# Patient Record
Sex: Female | Born: 1990 | Race: Black or African American | Hispanic: No | Marital: Single | Smoking: Never smoker
Health system: Southern US, Community
[De-identification: ages and names within clinical notes are randomized; demographics above are authoritative.]

## PROBLEM LIST (undated history)

## (undated) ENCOUNTER — Inpatient Hospital Stay (HOSPITAL_COMMUNITY): Payer: Self-pay

## (undated) DIAGNOSIS — G4730 Sleep apnea, unspecified: Secondary | ICD-10-CM

## (undated) DIAGNOSIS — I509 Heart failure, unspecified: Secondary | ICD-10-CM

## (undated) DIAGNOSIS — N390 Urinary tract infection, site not specified: Secondary | ICD-10-CM

## (undated) HISTORY — DX: Heart failure, unspecified: I50.9

## (undated) HISTORY — DX: Sleep apnea, unspecified: G47.30

## (undated) HISTORY — PX: NO PAST SURGERIES: SHX2092

---
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
## 2011-01-27 ENCOUNTER — Encounter (HOSPITAL_COMMUNITY): Payer: Self-pay

## 2011-01-27 ENCOUNTER — Inpatient Hospital Stay (HOSPITAL_COMMUNITY)
Admission: AD | Admit: 2011-01-27 | Discharge: 2011-01-30 | DRG: 775 | Disposition: A | Discharge status: Home / Self Care | Payer: Medicaid Other | Source: Ambulatory Visit | Attending: Obstetrics and Gynecology | Admitting: Obstetrics and Gynecology

## 2011-01-27 DIAGNOSIS — Z3490 Encounter for supervision of normal pregnancy, unspecified, unspecified trimester: Secondary | ICD-10-CM

## 2011-01-27 DIAGNOSIS — O169 Unspecified maternal hypertension, unspecified trimester: Secondary | ICD-10-CM | POA: Diagnosis present

## 2011-01-27 DIAGNOSIS — IMO0002 Reserved for concepts with insufficient information to code with codable children: Secondary | ICD-10-CM

## 2011-01-27 DIAGNOSIS — Z22330 Carrier of Group B streptococcus: Secondary | ICD-10-CM

## 2011-01-27 DIAGNOSIS — O99892 Other specified diseases and conditions complicating childbirth: Secondary | ICD-10-CM | POA: Diagnosis present

## 2011-01-27 DIAGNOSIS — D649 Anemia, unspecified: Secondary | ICD-10-CM | POA: Diagnosis not present

## 2011-01-27 DIAGNOSIS — O9903 Anemia complicating the puerperium: Secondary | ICD-10-CM | POA: Diagnosis not present

## 2011-01-27 DIAGNOSIS — O99820 Streptococcus B carrier state complicating pregnancy: Secondary | ICD-10-CM

## 2011-01-27 DIAGNOSIS — O4700 False labor before 37 completed weeks of gestation, unspecified trimester: Secondary | ICD-10-CM

## 2011-01-27 HISTORY — DX: Urinary tract infection, site not specified: N39.0

## 2011-01-27 LAB — CBC
MCH: 30.2 pg (ref 26.0–34.0)
MCHC: 34.9 g/dL (ref 30.0–36.0)
Platelets: 182 10*3/uL (ref 150–400)
RDW: 13.8 % (ref 11.5–15.5)

## 2011-01-27 LAB — HIV ANTIBODY (ROUTINE TESTING W REFLEX): HIV: NONREACTIVE

## 2011-01-27 LAB — COMPREHENSIVE METABOLIC PANEL
ALT: 10 U/L (ref 0–35)
AST: 16 U/L (ref 0–37)
Albumin: 2.7 g/dL — ABNORMAL LOW (ref 3.5–5.2)
Calcium: 9.6 mg/dL (ref 8.4–10.5)
Glucose, Bld: 84 mg/dL (ref 70–99)
Potassium: 3.2 mEq/L — ABNORMAL LOW (ref 3.5–5.1)
Sodium: 136 mEq/L (ref 135–145)
Total Protein: 6.7 g/dL (ref 6.0–8.3)

## 2011-01-27 LAB — ANTIBODY SCREEN: Antibody Screen: NEGATIVE

## 2011-01-27 LAB — POCT FERN TEST: Fern Test: POSITIVE

## 2011-01-27 MED ORDER — OXYCODONE-ACETAMINOPHEN 5-325 MG PO TABS
2.0000 | ORAL_TABLET | ORAL | Status: DC | PRN
  Administered 2011-01-28: 1 via ORAL
  Filled 2011-01-27: qty 2

## 2011-01-27 MED ORDER — IBUPROFEN 600 MG PO TABS: 600.0000 mg | ORAL_TABLET | Freq: Four times a day (QID) | ORAL | Status: DC | PRN

## 2011-01-27 MED ORDER — ONDANSETRON HCL 4 MG/2ML IJ SOLN: 4.0000 mg | Freq: Four times a day (QID) | INTRAMUSCULAR | Status: DC | PRN

## 2011-01-27 MED ORDER — HYDROXYZINE HCL 50 MG/ML IM SOLN
50.0000 mg | Freq: Four times a day (QID) | INTRAMUSCULAR | Status: DC | PRN
  Filled 2011-01-27: qty 1

## 2011-01-27 MED ORDER — FLEET ENEMA 7-19 GM/118ML RE ENEM: 1.0000 | ENEMA | RECTAL | Status: DC | PRN

## 2011-01-27 MED ORDER — LACTATED RINGERS IV SOLN
INTRAVENOUS | Status: DC
  Administered 2011-01-27: 18:00:00 via INTRAVENOUS

## 2011-01-27 MED ORDER — FENTANYL 2.5 MCG/ML BUPIVACAINE 1/10 % EPIDURAL INFUSION (WH - ANES)
14.0000 mL/h | INTRAMUSCULAR | Status: DC
  Filled 2011-01-27: qty 60

## 2011-01-27 MED ORDER — EPHEDRINE 5 MG/ML INJ
10.0000 mg | INTRAVENOUS | Status: DC | PRN
  Filled 2011-01-27 (×3): qty 4

## 2011-01-27 MED ORDER — PHENYLEPHRINE 40 MCG/ML (10ML) SYRINGE FOR IV PUSH (FOR BLOOD PRESSURE SUPPORT)
80.0000 ug | PREFILLED_SYRINGE | INTRAVENOUS | Status: DC | PRN
  Filled 2011-01-27: qty 5

## 2011-01-27 MED ORDER — DINOPROSTONE 10 MG VA INST
10.0000 mg | VAGINAL_INSERT | Freq: Once | VAGINAL | Status: DC
  Filled 2011-01-27: qty 1

## 2011-01-27 MED ORDER — EPHEDRINE 5 MG/ML INJ
10.0000 mg | INTRAVENOUS | Status: DC | PRN
  Filled 2011-01-27: qty 4

## 2011-01-27 MED ORDER — ACETAMINOPHEN 325 MG PO TABS: 650.0000 mg | ORAL_TABLET | ORAL | Status: DC | PRN

## 2011-01-27 MED ORDER — LIDOCAINE HCL (PF) 1 % IJ SOLN
30.0000 mL | INTRAMUSCULAR | Status: AC | PRN
  Administered 2011-01-28: 30 mL via SUBCUTANEOUS
  Filled 2011-01-27: qty 30

## 2011-01-27 MED ORDER — LACTATED RINGERS IV SOLN: 500.0000 mL | INTRAVENOUS | Status: DC | PRN

## 2011-01-27 MED ORDER — BUTORPHANOL TARTRATE 2 MG/ML IJ SOLN
1.0000 mg | INTRAMUSCULAR | Status: DC | PRN
  Administered 2011-01-27 – 2011-01-28 (×4): 1 mg via INTRAVENOUS
  Filled 2011-01-27 (×4): qty 1

## 2011-01-27 MED ORDER — LACTATED RINGERS IV SOLN: 500.0000 mL | Freq: Once | INTRAVENOUS | Status: DC

## 2011-01-27 MED ORDER — TERBUTALINE SULFATE 1 MG/ML IJ SOLN: 0.2500 mg | Freq: Once | INTRAMUSCULAR | Status: AC | PRN

## 2011-01-27 MED ORDER — PHENYLEPHRINE 40 MCG/ML (10ML) SYRINGE FOR IV PUSH (FOR BLOOD PRESSURE SUPPORT)
80.0000 ug | PREFILLED_SYRINGE | INTRAVENOUS | Status: DC | PRN
  Filled 2011-01-27 (×2): qty 5

## 2011-01-27 MED ORDER — HYDROXYZINE HCL 50 MG PO TABS
50.0000 mg | ORAL_TABLET | Freq: Four times a day (QID) | ORAL | Status: DC | PRN
  Filled 2011-01-27: qty 1

## 2011-01-27 MED ORDER — PENICILLIN G POTASSIUM 5000000 UNITS IJ SOLR
5.0000 10*6.[IU] | Freq: Once | INTRAVENOUS | Status: AC
  Administered 2011-01-27: 5 10*6.[IU] via INTRAVENOUS
  Filled 2011-01-27: qty 5

## 2011-01-27 MED ORDER — PENICILLIN G POTASSIUM 5000000 UNITS IJ SOLR
2.5000 10*6.[IU] | INTRAVENOUS | Status: DC
  Administered 2011-01-27 – 2011-01-28 (×2): 2.5 10*6.[IU] via INTRAVENOUS
  Filled 2011-01-27 (×5): qty 2.5

## 2011-01-27 MED ORDER — OXYTOCIN BOLUS FROM INFUSION
500.0000 mL | Freq: Once | INTRAVENOUS | Status: DC
  Filled 2011-01-27: qty 500
  Filled 2011-01-27: qty 1000

## 2011-01-27 MED ORDER — CITRIC ACID-SODIUM CITRATE 334-500 MG/5ML PO SOLN: 30.0000 mL | ORAL | Status: DC | PRN

## 2011-01-27 MED ORDER — DIPHENHYDRAMINE HCL 50 MG/ML IJ SOLN: 12.5000 mg | INTRAMUSCULAR | Status: DC | PRN

## 2011-01-27 MED ORDER — OXYTOCIN 20 UNITS IN LACTATED RINGERS INFUSION - SIMPLE
125.0000 mL/h | Freq: Once | INTRAVENOUS | Status: AC
  Administered 2011-01-28: 999 mL/h via INTRAVENOUS

## 2011-01-27 NOTE — Progress Notes (Signed)
Pt states she started leaking clear fluid at 1500, no active leaking. Pt is not wearing a pad. Pt is having some contractions. States no bleeding and the baby is moving well.

## 2011-01-27 NOTE — Progress Notes (Signed)
Mary Chang is a 20 y.o. G2P0010 at [redacted]w[redacted]d by LMP admitted for rupture of membranes  Subjective: Feels contractions with 5/10 intensity.  Objective: BP 157/88  Pulse 104  Temp(Src) 98 F (36.7 C) (Oral)  Resp 20  Ht 5\' 5"  (1.651 m)  Wt 226 lb (102.513 kg)  BMI 37.61 kg/m2  SpO2 99%  BP while lying on left side is 138/88    FHT:  FHR: 120's to 130's bpm, variability: minimal ,  accelerations:  Present,  decelerations:  Absent UC:   regular, every 3-5 minutes SVE:   Dilation: 3 Effacement (%): 80 Station: -1 Exam by:: Dr. Pennie Rushing  Labs: Lab Results  Component Value Date   WBC 8.0 01/27/2011   HGB 12.2 01/27/2011   HCT 35.0* 01/27/2011   MCV 86.6 01/27/2011   PLT 182 01/27/2011    Assessment / Plan: Spontaneous labor, progressing normally  Labor: Progressing normally to this point.  Preeclampsia:  labs stable and cath urine pending Fetal Wellbeing:  Category II Pain Control:  Stadol x1 with good relief I/D:  n/a Anticipated MOD:  NSVD Will plan re chk of cx in 2 hours.  If no progress at that time, will add pitocin.  Mary Chang P 01/27/2011, 11:26 PM

## 2011-01-27 NOTE — H&P (Signed)
Mary Chang is a 20 y.o. black female presenting with CC of SROM at 1500.  Denies PIH or UTI s/s.  Clear fluid leaking; has slowed some.  Soaked through clothes and s.o. Stopped at [employer]" to get change of clothes for her.  Onset of ctxs after rupture time.  Cx in office last week 1 cm per pt recall. Hungry and only had bowl of cereal earlier today. Maternal Medical History:  Reason for admission: Reason for admission: rupture of membranes.  Contractions: Onset was 3-5 hours ago.   Frequency: regular.   Perceived severity is moderate.    Fetal activity: Perceived fetal activity is normal.   Last perceived fetal movement was within the past hour.    Prenatal complications: 1.  GBS pos in urine 09/24/10 2.  Rubella Equivocal 3.  1st trimester trich 4.  H/o sti's 5.  H/o condylomata in pregnancy 6.  S>D--nml growth u/s at 36 weeks 7.  Late to Care 8.  Elevated 1hr gtt=140; nml 3hr gtt    OB History    Grav Para Term Preterm Abortions TAB SAB Ect Mult Living   1              Past Medical History  Diagnosis Date  . UTI (lower urinary tract infection)    History reviewed. No pertinent past surgical history. Family History: family history includes Diabetes in her father and paternal grandfather; Heart disease in her maternal grandmother; and Hypertension in her father, maternal grandmother, and paternal grandfather. Social History:  reports that she quit smoking about 7 months ago. Her smoking use included Cigarettes. She quit after 1 year of use. She does not have any smokeless tobacco history on file. She reports that she uses illicit drugs (Marijuana). She reports that she does not drink alcohol.  Review of Systems  Constitutional: Negative.   Respiratory: Negative.   Cardiovascular: Negative.   Gastrointestinal: Negative.        Recent loose stools  Genitourinary: Negative.   Skin: Negative.     Dilation: 1.5 Effacement (%): 80 Station: -2 Exam by:: H. [name]  CNM Blood pressure 138/75, pulse 107, temperature 98.8 F (37.1 C), temperature source Oral, resp. rate 20, height 5' 6.5" (1.689 m), weight 102.694 kg (226 lb 6.4 oz), SpO2 99.00%. Maternal Exam:  Uterine Assessment: Contraction strength is mild.  Contraction frequency is regular.   Abdomen: Patient reports no abdominal tenderness. Estimated fetal weight is 6.5-7.5 lbs.   Fetal presentation: vertex  Introitus: Normal vagina.  Ferning test: positive.  Amniotic fluid character: clear. Healed lesions on buttocks area; vaginal and vulva w/o lesions  Pelvis: adequate for delivery.   Cervix: Cervix evaluated by digital exam.   Cx:  1.5/80/-2  Fetal Exam Fetal Monitor Review: Mode: ultrasound.   Baseline rate: 140.  Variability: moderate (6-25 bpm).   Pattern: no accelerations and no decelerations.    Fetal State Assessment: Category I - tracings are normal.     Physical Exam  Constitutional: She is oriented to person, place, and time. She appears well-developed and well-nourished.       Grimace w/ some ctxs  Cardiovascular: Normal rate and regular rhythm.   Respiratory: Effort normal and breath sounds normal.  GI: Soft. Bowel sounds are normal.  Musculoskeletal: She exhibits edema.       1+ BLE edema  Neurological: She is alert and oriented to person, place, and time. She has normal reflexes.  Skin: Skin is warm and dry.  Psychiatric: She has  a normal mood and affect. Her behavior is normal. Judgment and thought content normal.    Prenatal labs: ABO, Rh:  O positive  Antibody:  neg Rubella:  equivocal RPR:   NR HBsAg:   Neg HIV:   NR GBS:   positive in Urine 09/24/10 Hgb Electrophoresis:  Neg  Assessment/Plan: 1.  IUP at 37.5 2.  SROM 1500 3.  Early labor 4.  GBS positive 5.  2 elevated BP readings on arrival to MAU 6.  Nonreactive NST at present, but neg CST and reassuring  1.  Admit to BS with Dr. Pennie Rushing as attending 2.  Routine L&D orders 3.  Add PIH labs;  will do cath u/a once gets epidural 4.  Pitocin or cervidil prn augmentation 5.  C/w MD prn; CTO BP closely  Adalid Beckmann H 01/27/2011, 5:25 PM

## 2011-01-28 ENCOUNTER — Encounter (HOSPITAL_COMMUNITY): Payer: Self-pay | Admitting: Anesthesiology

## 2011-01-28 ENCOUNTER — Encounter (HOSPITAL_COMMUNITY): Payer: Self-pay | Admitting: Family Medicine

## 2011-01-28 DIAGNOSIS — Z3490 Encounter for supervision of normal pregnancy, unspecified, unspecified trimester: Secondary | ICD-10-CM

## 2011-01-28 DIAGNOSIS — O169 Unspecified maternal hypertension, unspecified trimester: Secondary | ICD-10-CM | POA: Diagnosis present

## 2011-01-28 LAB — URINE MICROSCOPIC-ADD ON

## 2011-01-28 LAB — ABO/RH: ABO/RH(D): O POS

## 2011-01-28 LAB — URINALYSIS, ROUTINE W REFLEX MICROSCOPIC
Glucose, UA: NEGATIVE mg/dL
Specific Gravity, Urine: 1.01 (ref 1.005–1.030)

## 2011-01-28 LAB — CBC
Platelets: 187 10*3/uL (ref 150–400)
RDW: 14 % (ref 11.5–15.5)
WBC: 8.9 10*3/uL (ref 4.0–10.5)

## 2011-01-28 LAB — COMPREHENSIVE METABOLIC PANEL
Albumin: 2.3 g/dL — ABNORMAL LOW (ref 3.5–5.2)
BUN: 4 mg/dL — ABNORMAL LOW (ref 6–23)
Chloride: 99 mEq/L (ref 96–112)
Creatinine, Ser: 0.48 mg/dL — ABNORMAL LOW (ref 0.50–1.10)
GFR calc Af Amer: >90 mL/min (ref >90)
Glucose, Bld: 126 mg/dL — ABNORMAL HIGH (ref 70–99)
Total Bilirubin: 0.4 mg/dL (ref 0.3–1.2)
Total Protein: 5.8 g/dL — ABNORMAL LOW (ref 6.0–8.3)

## 2011-01-28 LAB — URIC ACID: Uric Acid, Serum: 5.7 mg/dL (ref 2.4–7.0)

## 2011-01-28 MED ORDER — OXYCODONE-ACETAMINOPHEN 5-325 MG PO TABS: 1.0000 | ORAL_TABLET | ORAL | Status: DC | PRN

## 2011-01-28 MED ORDER — MISOPROSTOL 200 MCG PO TABS
ORAL_TABLET | ORAL | Status: AC
  Administered 2011-01-28: 800 ug
  Filled 2011-01-28: qty 4

## 2011-01-28 MED ORDER — MEDROXYPROGESTERONE ACETATE 150 MG/ML IM SUSP: 150.0000 mg | INTRAMUSCULAR | Status: DC | PRN

## 2011-01-28 MED ORDER — ONDANSETRON HCL 4 MG/2ML IJ SOLN: 4.0000 mg | INTRAMUSCULAR | Status: DC | PRN

## 2011-01-28 MED ORDER — ONDANSETRON HCL 4 MG PO TABS: 4.0000 mg | ORAL_TABLET | ORAL | Status: DC | PRN

## 2011-01-28 MED ORDER — LACTATED RINGERS IV SOLN: INTRAVENOUS | Status: DC

## 2011-01-28 MED ORDER — SIMETHICONE 80 MG PO CHEW: 80.0000 mg | CHEWABLE_TABLET | ORAL | Status: DC | PRN

## 2011-01-28 MED ORDER — BENZOCAINE-MENTHOL 20-0.5 % EX AERO: 1.0000 "application " | INHALATION_SPRAY | CUTANEOUS | Status: DC | PRN

## 2011-01-28 MED ORDER — IBUPROFEN 600 MG PO TABS
600.0000 mg | ORAL_TABLET | Freq: Four times a day (QID) | ORAL | Status: DC
  Administered 2011-01-28 – 2011-01-30 (×6): 600 mg via ORAL
  Filled 2011-01-28 (×7): qty 1

## 2011-01-28 MED ORDER — LABETALOL HCL 5 MG/ML IV SOLN
10.0000 mg | INTRAVENOUS | Status: DC | PRN
  Administered 2011-01-28 (×2): 10 mg via INTRAVENOUS
  Filled 2011-01-28 (×3): qty 4

## 2011-01-28 MED ORDER — BENZOCAINE-MENTHOL 20-0.5 % EX AERO
INHALATION_SPRAY | CUTANEOUS | Status: AC
  Filled 2011-01-28: qty 56

## 2011-01-28 MED ORDER — SENNOSIDES-DOCUSATE SODIUM 8.6-50 MG PO TABS
2.0000 | ORAL_TABLET | Freq: Every day | ORAL | Status: DC
  Administered 2011-01-28 – 2011-01-29 (×2): 2 via ORAL

## 2011-01-28 MED ORDER — DIPHENHYDRAMINE HCL 25 MG PO CAPS: 25.0000 mg | ORAL_CAPSULE | Freq: Four times a day (QID) | ORAL | Status: DC | PRN

## 2011-01-28 MED ORDER — TETANUS-DIPHTH-ACELL PERTUSSIS 5-2.5-18.5 LF-MCG/0.5 IM SUSP: 0.5000 mL | Freq: Once | INTRAMUSCULAR | Status: DC

## 2011-01-28 MED ORDER — PRENATAL PLUS 27-1 MG PO TABS
1.0000 | ORAL_TABLET | Freq: Every day | ORAL | Status: DC
  Administered 2011-01-28 – 2011-01-30 (×3): 1 via ORAL
  Filled 2011-01-28 (×3): qty 1

## 2011-01-28 MED ORDER — WITCH HAZEL-GLYCERIN EX PADS: 1.0000 "application " | MEDICATED_PAD | CUTANEOUS | Status: DC | PRN

## 2011-01-28 MED ORDER — LANOLIN HYDROUS EX OINT: TOPICAL_OINTMENT | CUTANEOUS | Status: DC | PRN

## 2011-01-28 MED ORDER — MISOPROSTOL 200 MCG PO TABS
ORAL_TABLET | ORAL | Status: AC
  Filled 2011-01-28: qty 1

## 2011-01-28 MED ORDER — DIBUCAINE 1 % RE OINT: 1.0000 "application " | TOPICAL_OINTMENT | RECTAL | Status: DC | PRN

## 2011-01-28 MED ORDER — ZOLPIDEM TARTRATE 5 MG PO TABS: 5.0000 mg | ORAL_TABLET | Freq: Every evening | ORAL | Status: DC | PRN

## 2011-01-28 NOTE — Progress Notes (Signed)
NSVD of a viable female.  

## 2011-01-28 NOTE — Progress Notes (Signed)
PSYCHOSOCIAL ASSESSMENT ~ MATERNAL/CHILD Name:  Mary Chang                                                                                                         Age: 20  Referral Date:        10/  02 /  12 Reason/Source: History of MJ use / CN  I. FAMILY/HOME ENVIRONMENT A. Child's Legal Guardian _X__Parent(s) ___Grandparent ___Foster parent ___DSS_________________ Name:  Mary Chang                                                              DOB: //                     Age: 20  Address: [address] ; [place], [place] [address]  Name:    Mary Chang                                                            DOB: //                     Age: 33  Address: (same as above)   B. Other Household Members/Support Persons Name:                                         Relationship:                        DOB ___/___/___                   Name:                                         Relationship:                        DOB ___/___/___                   Name:                                         Relationship:                        DOB ___/___/___                   

## 2011-01-28 NOTE — Anesthesia Preprocedure Evaluation (Deleted)
Anesthesia Evaluation  Name, MR# and DOB Patient awake  General Assessment Comment  Reviewed: Allergy & Precautions, H&P , Patient's Chart, lab work & pertinent test results  Airway Mallampati: III TM Distance: >3 FB Neck ROM: full    Dental No notable dental hx.    Pulmonary  clear to auscultation  Pulmonary exam normal       Cardiovascular hypertension, regular Normal    Neuro/Psych Negative Neurological ROS  Negative Psych ROS   GI/Hepatic negative GI ROS Neg liver ROS    Endo/Other  Negative Endocrine ROSMorbid obesity  Renal/GU negative Renal ROS     Musculoskeletal   Abdominal   Peds  Hematology negative hematology ROS (+)   Anesthesia Other Findings   Reproductive/Obstetrics (+) Pregnancy                           Anesthesia Physical Anesthesia Plan  ASA: III  Anesthesia Plan: Epidural   Post-op Pain Management:    Induction:   Airway Management Planned:   Additional Equipment:   Intra-op Plan:   Post-operative Plan:   Informed Consent: I have reviewed the patients History and Physical, chart, labs and discussed the procedure including the risks, benefits and alternatives for the proposed anesthesia with the patient or authorized representative who has indicated his/her understanding and acceptance.     Plan Discussed with:   Anesthesia Plan Comments: (Discussed risk and benefits.  Patient was somnolent and unable to express her will to have an epidural.  She however expressed her concerns and fears.  Her mother cried and screamed that she did not want her to have an epidural.  Her husband stated that if anything went wrong that "some criminal shit would go down".  I expressed that epidurals were safe but not risk free and that they would have to accept the risks in order to get one.  She had been given IV pain medicine and fell asleep in between contractions but then wake  up during them.  She either couldn't or didn't want to give consent, so the plan was to return around 6 am if she decided that she wanted to proceed understanding the risks.  )       Anesthesia Quick Evaluation

## 2011-01-28 NOTE — Op Note (Signed)
Delivery Note At 4:40 AM a viable female was delivered via Vaginal, Spontaneous Delivery (Presentation: Middle Occiput Anterior).  APGAR: 9, 9; weight 7 lb 1 oz (3204 g).   Placenta status: Intact, Spontaneous.  Cord: 3 vessels with the following complications: None.  Cord pH: n/a  Anesthesia: Local  Episiotomy: None Lacerations: 2nd degree;Perineal Suture Repair: 3.0 vicryl Est. Blood Loss (mL): 750  Mom to postpartum.  Baby to nursery-stable. Pt had elevated BPs during labor requiring a single does of IV labetalol.  All PIH labs nl.  Billy Turvey P 01/28/2011, 5:19 AM

## 2011-01-28 NOTE — Progress Notes (Signed)
UR chart review completed.  

## 2011-01-28 NOTE — Progress Notes (Signed)
Mary Chang is a 20 y.o. G2P0010 at [redacted]w[redacted]d by LMP admitted for rupture of membranes  Subjective: Significant pain from contractions.  Relief between contractions from Stadol.  Feels some pressure   Objective: BP 149/103  Pulse 97  Temp(Src) 98.4 F (36.9 C) (Oral)  Resp 18  Ht 5\' 5"  (1.651 m)  Wt 226 lb (102.513 kg)  BMI 37.61 kg/m2  SpO2 98%      FHT:  FHR: 150's bpm, variability: moderate,  accelerations:  Present,  decelerations:  Present occassionally UC:   regular, every 3-5 minutes SVE:   Dilation: 7 Effacement (%): 90 Station: -1 Exam by:: Dr. Pennie Rushing  Labs: Lab Results  Component Value Date   WBC 8.9 01/28/2011   HGB 11.9* 01/28/2011   HCT 34.9* 01/28/2011   MCV 87.0 01/28/2011   PLT 187 01/28/2011    Assessment / Plan:   Labor: no cervical change in the last hour Preeclampsia:  elevated blood pressures requiring a single does of labetalol.  All other PIH parameters including serology and urine are negative Fetal Wellbeing:  Category II Pain Control:  Pt declines epidural.  Wants to continue to use Stadol for pain relief. I/D:  n/a Anticipated MOD:  NSVD Pt currently declines pitocin augmentation in spite of no change in cervix over the last hour.  She agrees to reassessment in 1 hour for posssible pitocin augmentation.  Mary Chang P 01/28/2011, 4:28 AM

## 2011-01-29 LAB — CBC
Hemoglobin: 10.5 g/dL — ABNORMAL LOW (ref 12.0–15.0)
MCH: 30.3 pg (ref 26.0–34.0)
MCHC: 34.8 g/dL (ref 30.0–36.0)
Platelets: 166 10*3/uL (ref 150–400)

## 2011-01-29 NOTE — Progress Notes (Signed)
  Post Partum Day 1 Subjective: no complaints, up ad lib, voiding, tolerating PO and SO at bs. pt seems somewhat distracted when reviewing assessment and POC. infant w bili light in room being attended to by nurse at the moment BF attempting, using bottle overnight and now Mood stable, bonding well Pt denies HA/N/V epigastric pain.   Objective: Blood pressure 122/79, pulse 111, temperature 98.3 F (36.8 C), temperature source Oral, resp. rate 20, height 5\' 5"  (1.651 m), weight 102.513 kg (226 lb), SpO2 99.00%,   Filed Vitals:   01/28/11 1145 01/28/11 1420 01/28/11 2000 01/29/11 0619  BP: 136/89 141/91 138/82 122/79  Pulse: 118 111 108 111  Temp: 98.9 F (37.2 C) 98.8 F (37.1 C) 98 F (36.7 C) 98.3 F (36.8 C)  TempSrc: Oral Oral Oral Oral  Resp: 20 20 18 20   Height:      Weight:      SpO2:   99%      Physical Exam:  General: alert, distracted, fatigued and no distress Lungs: CTAB Heart: RRR Lochia: appropriate Uterine Fundus: firm Incision: healing well DVT Evaluation: No evidence of DVT seen on physical exam. Negative Homan's sign.   Basename 01/29/11 0615 01/28/11 0315  HGB 10.5* 11.9*  HCT 30.2* 34.9*    Assessment/Plan: Plan for discharge tomorrow, Breastfeeding, Lactation consult, Social Work consult and Circumcision at office Discussed contraception options, pt unsure but thinks would like pill Plan for flu shot and TDAP if not already done before discharge.   Will CTO BP, consider 24hr urine collection [facility] labs were WNL   LOS: 2 days   Belen Zwahlen M 01/29/2011, 8:35 AM

## 2011-01-30 MED ORDER — NORETHINDRONE 0.35 MG PO TABS: 1.0000 | ORAL_TABLET | Freq: Every day | ORAL | Status: DC

## 2011-01-30 MED ORDER — IBUPROFEN 600 MG PO TABS: 600.0000 mg | ORAL_TABLET | Freq: Four times a day (QID) | ORAL | Status: AC

## 2011-01-30 NOTE — Progress Notes (Signed)
  Post Partum Day 2 Subjective: no complaints, up ad lib, voiding, tolerating PO, + flatus and +BM BF pumping, baby using bottle now Mood stable, bonding well   Objective: Blood pressure 126/89, pulse 117, temperature 98.2 F (36.8 C), temperature source Oral, resp. rate 22, height 5\' 5"  (1.651 m), weight 102.513 kg (226 lb), SpO2 99.00%, unknown if currently breastfeeding.  Physical Exam:  General: alert and no distress Lungs: CTAB Heart: RRR Lochia: appropriate Uterine Fundus: firm Incision: healing well DVT Evaluation: No evidence of DVT seen on physical exam. Negative Homan's sign.   Basename 01/29/11 0615 01/28/11 0315  HGB 10.5* 11.9*  HCT 30.2* 34.9*    Assessment/Plan: Discharge home, Breastfeeding and Contraception pt desires OCP Mild anemia, continue FE and PNV at home     LOS: 3 days   Laurieann Friddle M 01/30/2011, 9:55 AM

## 2011-01-30 NOTE — Plan of Care (Signed)
Problem: Discharge Progression Outcomes Goal: MMR given as ordered Outcome: Not Progressing refused

## 2011-01-30 NOTE — Discharge Summary (Signed)
   Obstetric Discharge Summary Reason for Admission: rupture of membranes Prenatal Procedures: ultrasound Intrapartum Procedures: spontaneous vaginal delivery and GBS prophylaxis Postpartum Procedures: tdap Complications-Operative and Postpartum: none  Temp:  [98.2 F (36.8 C)-98.7 F (37.1 C)] 98.2 F (36.8 C) (10/04 0505) Pulse Rate:  [105-117] 117  (10/04 0505) Resp:  [20-22] 22  (10/04 0505) BP: (125-147)/(86-96) 126/89 mmHg (10/04 0505) Hemoglobin  Date Value Range Status  01/29/2011 10.5* 12.0-15.0 (g/dL) Final     HCT  Date Value Range Status  01/29/2011 30.2* 36.0-46.0 (%) Final    Hospital Course:  Pt arrived with SROM and no labor signs, active labor started approximately 8 hours later, pt had stadol for pain mgmnt and had an SVD of a boy without complications, She followed a routine pp course.   Discharge Diagnoses: Term Pregnancy-delivered  Discharge Information: Date: 01/30/2011 Activity: pelvic rest Diet: routine Medications:  Medication List  As of 01/30/2011 10:01 AM   START taking these medications         ibuprofen 600 MG tablet   Commonly known as: ADVIL,MOTRIN   Take 1 tablet (600 mg total) by mouth every 6 (six) hours.      norethindrone 0.35 MG tablet   Commonly known as: MICRONOR,CAMILA,ERRIN   Take 1 tablet (0.35 mg total) by mouth daily.   Start taking on: 02/13/2011         CONTINUE taking these medications         prenatal vitamin w/FE, FA 27-1 MG Tabs          Where to get your medications    These are the prescriptions that you need to pick up.   You may get these medications from any pharmacy.         ibuprofen 600 MG tablet   norethindrone 0.35 MG tablet           Condition: stable Instructions: refer to practice specific booklet Discharge to: home Follow-up Information    Follow up with Janine Limbo, MD in 6 weeks.   Contact information:   [address] [place]  [address] [phone]          Newborn Data: Live born  Information for the patient's newborn:  Chang, Boy Mary [[id number]]  female APGAR 9, 9,   weight ; 7#1oz Home with mother.  Mary Chang 01/30/2011, 10:01 AM

## 2011-02-02 ENCOUNTER — Encounter (HOSPITAL_COMMUNITY): Payer: Self-pay

## 2011-02-02 ENCOUNTER — Inpatient Hospital Stay (HOSPITAL_COMMUNITY): Payer: Medicaid Other

## 2011-02-02 ENCOUNTER — Other Ambulatory Visit: Payer: Self-pay

## 2011-02-02 ENCOUNTER — Inpatient Hospital Stay (HOSPITAL_COMMUNITY)
Admission: AD | Admit: 2011-02-02 | Discharge: 2011-02-03 | DRG: 776 | Disposition: A | Discharge status: Home / Self Care | Payer: Medicaid Other | Source: Ambulatory Visit | Attending: Obstetrics and Gynecology | Admitting: Obstetrics and Gynecology

## 2011-02-02 DIAGNOSIS — J811 Chronic pulmonary edema: Secondary | ICD-10-CM

## 2011-02-02 DIAGNOSIS — IMO0001 Reserved for inherently not codable concepts without codable children: Secondary | ICD-10-CM

## 2011-02-02 DIAGNOSIS — O99893 Other specified diseases and conditions complicating puerperium: Principal | ICD-10-CM | POA: Diagnosis present

## 2011-02-02 DIAGNOSIS — G4730 Sleep apnea, unspecified: Secondary | ICD-10-CM | POA: Diagnosis present

## 2011-02-02 DIAGNOSIS — D649 Anemia, unspecified: Secondary | ICD-10-CM

## 2011-02-02 HISTORY — DX: Sleep apnea, unspecified: G47.30

## 2011-02-02 LAB — COMPREHENSIVE METABOLIC PANEL
ALT: 32 U/L (ref 0–35)
AST: 40 U/L — ABNORMAL HIGH (ref 0–37)
Alkaline Phosphatase: 91 U/L (ref 39–117)
CO2: 26 mEq/L (ref 19–32)
Calcium: 8.5 mg/dL (ref 8.4–10.5)
Chloride: 102 mEq/L (ref 96–112)
GFR calc Af Amer: >90 mL/min (ref >90)
GFR calc non Af Amer: >90 mL/min (ref >90)
Glucose, Bld: 75 mg/dL (ref 70–99)
Potassium: 3.1 mEq/L — ABNORMAL LOW (ref 3.5–5.1)
Sodium: 136 mEq/L (ref 135–145)
Total Bilirubin: 0.5 mg/dL (ref 0.3–1.2)

## 2011-02-02 LAB — URINALYSIS, ROUTINE W REFLEX MICROSCOPIC
Leukocytes, UA: NEGATIVE
Nitrite: NEGATIVE
Specific Gravity, Urine: 1.015 (ref 1.005–1.030)
Urobilinogen, UA: 0.2 mg/dL (ref 0.0–1.0)
pH: 7.5 (ref 5.0–8.0)

## 2011-02-02 LAB — DIFFERENTIAL
Basophils Absolute: 0 10*3/uL (ref 0.0–0.1)
Eosinophils Relative: 3 % (ref 0–5)
Lymphocytes Relative: 13 % (ref 12–46)
Lymphs Abs: 1.2 10*3/uL (ref 0.7–4.0)
Neutro Abs: 7.4 10*3/uL (ref 1.7–7.7)
Neutrophils Relative %: 77 % (ref 43–77)

## 2011-02-02 LAB — CBC
MCV: 87.5 fL (ref 78.0–100.0)
Platelets: 253 10*3/uL (ref 150–400)
RBC: 3.43 MIL/uL — ABNORMAL LOW (ref 3.87–5.11)
RDW: 13.8 % (ref 11.5–15.5)
WBC: 9.6 10*3/uL (ref 4.0–10.5)

## 2011-02-02 LAB — RAPID URINE DRUG SCREEN, HOSP PERFORMED
Amphetamines: NOT DETECTED
Cocaine: NOT DETECTED
Opiates: NOT DETECTED
Tetrahydrocannabinol: NOT DETECTED

## 2011-02-02 LAB — RAPID STREP SCREEN (MED CTR MEBANE ONLY): Streptococcus, Group A Screen (Direct): NEGATIVE

## 2011-02-02 MED ORDER — IBUPROFEN 600 MG PO TABS
600.0000 mg | ORAL_TABLET | Freq: Four times a day (QID) | ORAL | Status: DC | PRN
  Administered 2011-02-02: 600 mg via ORAL
  Filled 2011-02-02: qty 1

## 2011-02-02 MED ORDER — FUROSEMIDE 10 MG/ML IJ SOLN
40.0000 mg | INTRAMUSCULAR | Status: DC | PRN
  Administered 2011-02-02 – 2011-02-03 (×3): 40 mg via INTRAVENOUS
  Filled 2011-02-02: qty 4

## 2011-02-02 MED ORDER — LACTATED RINGERS IV SOLN: INTRAVENOUS | Status: DC

## 2011-02-02 MED ORDER — LORATADINE 10 MG PO TABS
10.0000 mg | ORAL_TABLET | Freq: Every day | ORAL | Status: DC
  Administered 2011-02-02 – 2011-02-03 (×2): 10 mg via ORAL
  Filled 2011-02-02 (×3): qty 1

## 2011-02-02 MED ORDER — FUROSEMIDE 10 MG/ML IJ SOLN
20.0000 mg | Freq: Once | INTRAMUSCULAR | Status: AC
  Administered 2011-02-02: 20 mg via INTRAVENOUS
  Filled 2011-02-02: qty 2

## 2011-02-02 MED ORDER — LACTATED RINGERS IV SOLN
INTRAVENOUS | Status: DC
  Administered 2011-02-02: 14:00:00 via INTRAVENOUS

## 2011-02-02 MED ORDER — IOHEXOL 300 MG/ML  SOLN: 100.0000 mL | Freq: Once | INTRAMUSCULAR | Status: DC | PRN

## 2011-02-02 MED ORDER — PRENATAL PLUS 27-1 MG PO TABS
1.0000 | ORAL_TABLET | Freq: Every day | ORAL | Status: DC
  Administered 2011-02-03: 1 via ORAL
  Filled 2011-02-02 (×5): qty 1

## 2011-02-02 MED ORDER — POTASSIUM CHLORIDE CRYS ER 20 MEQ PO TBCR
20.0000 meq | EXTENDED_RELEASE_TABLET | Freq: Two times a day (BID) | ORAL | Status: DC
  Administered 2011-02-02 – 2011-02-03 (×2): 20 meq via ORAL
  Filled 2011-02-02 (×4): qty 1

## 2011-02-02 MED ORDER — HYDROXYZINE HCL 50 MG/ML IM SOLN
50.0000 mg | Freq: Once | INTRAMUSCULAR | Status: AC
  Administered 2011-02-02: 50 mg via INTRAMUSCULAR
  Filled 2011-02-02: qty 1

## 2011-02-02 NOTE — ED Provider Notes (Signed)
History     Chief Complaint  Patient presents with  . Shortness of Breath  . Sore Throat  . Cough   HPI Comments: Pt is a 20yo G2p1011, that is 6 days s/p SVD, pt's CC today is SOB, cough with sore throat. States when she woke up this AM she had sudden onset of SOB. She states she feels as if she can't fill her lungs well. She reports some chest pain when trying to take a deep breath.   Pt had some elevated pressures in labor that required one dose of IV labetalol. BP range postpartum was 122-141/79-91. Otherwise labor and pp course was without complications. Infant did require longer hospitalization secondary to elevated bilirubin. Pt was GBS positive and tx'd with PCN in labor.  Pt reports no history of asthma or similar sx's before. She does state she thinks she has "sleep apnea" but has never been diagnosed.  Pt has a hx of MJ use up to last month of preg, denies any recent use. Pt is also a smoker with attempts to quit unsuccessful.    Shortness of Breath This is a new problem. The current episode started today. The problem occurs constantly. The problem has been unchanged. Associated symptoms include chest pain, orthopnea, a sore throat and sputum production. Exacerbated by: talking or exertion. Risk factors: 6 days postpartum.  Sore Throat  Associated symptoms include congestion, coughing and shortness of breath.  Cough Associated symptoms include chest pain, a sore throat and shortness of breath.      Past Medical History  Diagnosis Date  . UTI (lower urinary tract infection)   . Sleep apnea     Past Surgical History  Procedure Date  . No past surgeries     Family History  Problem Relation Age of Onset  . Hypertension Father   . Diabetes Father   . Heart disease Maternal Grandmother   . Hypertension Maternal Grandmother   . Diabetes Paternal Grandfather   . Hypertension Paternal Grandfather     History  Substance Use Topics  . Smoking status: Current Some Day  Smoker -- 0.2 packs/day for 1 years    Types: Cigarettes    Last Attempt to Quit: 06/12/2010  . Smokeless tobacco: Never Used  . Alcohol Use: No     states she used MJ on and off through 8th month for nausea   Social History: 20yo single AA female. In cosmetology school. Lives with Adela Lank (father of the baby).   Allergies: No Known Allergies  Prescriptions prior to admission  Medication Sig Dispense Refill  . ibuprofen (ADVIL,MOTRIN) 600 MG tablet Take 1 tablet (600 mg total) by mouth every 6 (six) hours.  30 tablet  2  . norethindrone (ORTHO MICRONOR) 0.35 MG tablet Take 1 tablet (0.35 mg total) by mouth daily.  3 Package  11  . prenatal vitamin w/FE, FA (PRENATAL 1 + 1) 27-1 MG TABS Take 1 tablet by mouth daily.          Review of Systems  HENT: Positive for congestion and sore throat.   Respiratory: Positive for cough, sputum production and shortness of breath.   Cardiovascular: Positive for chest pain and orthopnea.  Musculoskeletal: Positive for back pain.       States that has been normal for her  All other systems reviewed and are negative.   Physical Exam   Blood pressure 140/93, pulse 102, temperature 98.5 F (36.9 C), temperature source Oral, resp. rate 16, SpO2 98.00%, unknown if  currently breastfeeding.  Physical Exam  Constitutional: She is oriented to person, place, and time. She appears well-developed and well-nourished. She appears distressed. Nasal cannula in place.       Visibly short of air, worse when talking or moving. Using accessory muscles.    HENT:  Mouth/Throat: Mucous membranes are normal. Oropharyngeal exudate present.  Neck: Normal range of motion.  Cardiovascular: Regular rhythm and normal heart sounds.        tachycardia  Respiratory: Accessory muscle usage present. Tachypnea noted. No respiratory distress. She has decreased breath sounds in the right lower field. She has no wheezes. She has no rhonchi. She has no rales.       Diminished sounds  bilateral lower lobes  GI: Soft. She exhibits no distension.  Musculoskeletal: Normal range of motion. She exhibits edema.       +2 bilateral LE edema, pt states it's about the same since delivery   Lymphadenopathy:    She has no cervical adenopathy.  Neurological: She is alert and oriented to person, place, and time. She has normal reflexes.  Skin: Skin is warm and dry.  Psychiatric: She has a normal mood and affect. Her behavior is normal. Judgment and thought content normal.   Current wgt 97.6kg Weight upon D/C after delivery on 10-3 was 102.5kg   MAU Course  Procedures    Assessment and Plan  20yo s/p vaginal delivery with sudden onset of SOA Anxiety Diminished breath sounds Basilar atelectasis on chest xray Cbc and cmet pending Hx of some HTN in labor requiring 1 dose of IV labetalol Hx MJ use Hx smoking  Plan:  IVF LR,  Lasix 20mg  IVP Foley catheter with UA and UDS  Rapid strep cx CBC and CMET CT angio to r/o PE EKG  Consulted with Dr. Waylan Rocher M 02/02/2011, 11:58 AM   Plan to admit pt for 23 hour observation. Dr Pennie Rushing attending.

## 2011-02-02 NOTE — Progress Notes (Signed)
Clarified O2 order with CNM, to have prn to remain above 93%.  On room air, O2 sat is 98-99%.  Taken to room 110 after report to [place], Charity fundraiser, via w/c.

## 2011-02-02 NOTE — Progress Notes (Signed)
Post Partum Day #6 Subjective: SOB improved.  Objective: Blood pressure 139/93, pulse 105, temperature 98.7 F (37.1 C), temperature source Oral, resp. rate 20, height 5\' 5"  (1.651 m), weight 215 lb 3.2 oz (97.614 kg), SpO2 98.00%, unknown if currently breastfeeding.  Physical Exam:  General: alert, fatigued, mild distress, moderately obese and doing mouth breathing secondary to nasal congestion which is chronic by history Lochia: appropriate Uterine Fundus: firm Incision: na DVT Evaluation: No evidence of DVT seen on physical exam.   Basename 02/02/11 1150  HGB 10.1*  HCT 30.0*    Assessment/Plan: Pulmonary edema [place] hypertension Probable sleep apnea  Plan: Continue IV lasix to continure diuresis Possible discharge home in am with HCTZ and potassium Start Claritin for nasal congestion Outpatient workup for sleep apnea   LOS: 0 days   Loretha Ure P 02/02/2011, 6:37 PM

## 2011-02-02 NOTE — H&P (Signed)
Brief H&P (see previous ED note)  Pt is a 20yo single AA female s/p SVD 6 days ago. She is a G2P1011.   Will admit for 23 hour observation.  Dr Pennie Rushing attending

## 2011-02-02 NOTE — Progress Notes (Signed)
Onset of shortness of breath, sore throat and cough with yellow sputum since this morning, vaginal delivery 6 days ago.

## 2011-02-03 DIAGNOSIS — G4730 Sleep apnea, unspecified: Secondary | ICD-10-CM | POA: Diagnosis present

## 2011-02-03 LAB — COMPREHENSIVE METABOLIC PANEL
ALT: 44 U/L — ABNORMAL HIGH (ref 0–35)
AST: 35 U/L (ref 0–37)
Calcium: 8.5 mg/dL (ref 8.4–10.5)
Creatinine, Ser: 0.5 mg/dL (ref 0.50–1.10)
GFR calc Af Amer: >90 mL/min (ref >90)
Sodium: 139 mEq/L (ref 135–145)
Total Protein: 6.6 g/dL (ref 6.0–8.3)

## 2011-02-03 MED ORDER — POTASSIUM CHLORIDE CRYS ER 20 MEQ PO TBCR
20.0000 meq | EXTENDED_RELEASE_TABLET | Freq: Every day | ORAL | Status: DC
  Filled 2011-02-03 (×2): qty 1

## 2011-02-03 MED ORDER — HYDROCHLOROTHIAZIDE 25 MG PO TABS: 25.0000 mg | ORAL_TABLET | Freq: Every day | ORAL | Status: DC

## 2011-02-03 MED ORDER — HYDROCHLOROTHIAZIDE 25 MG PO TABS
25.0000 mg | ORAL_TABLET | Freq: Every day | ORAL | Status: DC
  Filled 2011-02-03: qty 1

## 2011-02-03 MED ORDER — POTASSIUM CHLORIDE CRYS ER 20 MEQ PO TBCR: 20.0000 meq | EXTENDED_RELEASE_TABLET | Freq: Every day | ORAL | Status: DC

## 2011-02-03 NOTE — Discharge Summary (Signed)
Physician Discharge Summary  Patient ID: Mary Chang MRN: [id number] DOB/AGE: 1990/06/17 20 y.o.  Admit date: 02/02/2011 Discharge date: 02/03/2011  Admission Diagnoses:  6 day s/p vaginal delivery                                          Pulmonary edema                                          Probable sleep apnea   Discharge Diagnoses: 7 day s/p vaginal delivery                                         Pulmonary edema--resolved                Active Problems:  Sleep apnea   Discharged Condition: stable  Hospital Course: Delivered 01/28/11, with sporadic elevation of BP during labor, with single dose of IV labetolol during labor.  Had uncomplicated postdelivery course.  Onset of SOB, congestion yesterday.  Pulmonary edema noted on Xray.  Given Lasix, with significant diuresis.  Hx likely sleep apnea, with decreased sats at night and loud snoring.  Mildly hypokalemic, with replacement initiated po.  Status much improved on the morning of 02/03/11, with patient desiring d/c.  Per consult with Dr. Normand Sloop as attending, patient was d/c'd home.  I spoke with Dr. Sung Amabile to coordinate outpatient pulmonology referral for f/u of pulmonary edema and likely sleep apnea.  Arranged for appointment with Dr. Delford Field at [facility] on 02/04/11 at 11am.  D/C'd home on HCTZ 25 mg po q day x 7 days, and K+ 20 meq x 7 days.  Will continue with plan for 6 weeks pp exam at [facility].  Results for orders placed during the hospital encounter of 02/02/11 (from the past 24 hour(s))  CBC     Status: Abnormal   Collection Time   02/02/11 11:50 AM      Component Value Range   WBC 9.6  4.0 - 10.5 (K/uL)   RBC 3.43 (*) 3.87 - 5.11 (MIL/uL)   Hemoglobin 10.1 (*) 12.0 - 15.0 (g/dL)   HCT 40.9 (*) 81.1 - 46.0 (%)   MCV 87.5  78.0 - 100.0 (fL)   MCH 29.4  26.0 - 34.0 (pg)   MCHC 33.7  30.0 - 36.0 (g/dL)   RDW 91.4  78.2 - 95.6 (%)   Platelets 253  150 - 400 (K/uL)  DIFFERENTIAL     Status: Normal   Collection Time     02/02/11 11:50 AM      Component Value Range   Neutrophils Relative 77  43 - 77 (%)   Neutro Abs 7.4  1.7 - 7.7 (K/uL)   Lymphocytes Relative 13  12 - 46 (%)   Lymphs Abs 1.2  0.7 - 4.0 (K/uL)   Monocytes Relative 7  3 - 12 (%)   Monocytes Absolute 0.7  0.1 - 1.0 (K/uL)   Eosinophils Relative 3  0 - 5 (%)   Eosinophils Absolute 0.3  0.0 - 0.7 (K/uL)   Basophils Relative 0  0 - 1 (%)   Basophils Absolute 0.0  0.0 - 0.1 (K/uL)  COMPREHENSIVE METABOLIC PANEL  Status: Abnormal   Collection Time   02/02/11 11:50 AM      Component Value Range   Sodium 136  135 - 145 (mEq/L)   Potassium 3.1 (*) 3.5 - 5.1 (mEq/L)   Chloride 102  96 - 112 (mEq/L)   CO2 26  19 - 32 (mEq/L)   Glucose, Bld 75  70 - 99 (mg/dL)   BUN 7  6 - 23 (mg/dL)   Creatinine, Ser 1.61 (*) 0.50 - 1.10 (mg/dL)   Calcium 8.5  8.4 - 09.6 (mg/dL)   Total Protein 6.0  6.0 - 8.3 (g/dL)   Albumin 2.6 (*) 3.5 - 5.2 (g/dL)   AST 40 (*) 0 - 37 (U/L)   ALT 32  0 - 35 (U/L)   Alkaline Phosphatase 91  39 - 117 (U/L)   Total Bilirubin 0.5  0.3 - 1.2 (mg/dL)   GFR calc non Af Amer >90  >90 (mL/min)   GFR calc Af Amer >90  >90 (mL/min)  URINALYSIS, ROUTINE W REFLEX MICROSCOPIC     Status: Abnormal   Collection Time   02/02/11 12:40 PM      Component Value Range   Color, Urine YELLOW  YELLOW    Appearance HAZY (*) CLEAR    Specific Gravity, Urine 1.015  1.005 - 1.030    pH 7.5  5.0 - 8.0    Glucose, UA NEGATIVE  NEGATIVE (mg/dL)   Hgb urine dipstick NEGATIVE  NEGATIVE    Bilirubin Urine NEGATIVE  NEGATIVE    Ketones, ur NEGATIVE  NEGATIVE (mg/dL)   Protein, ur NEGATIVE  NEGATIVE (mg/dL)   Urobilinogen, UA 0.2  0.0 - 1.0 (mg/dL)   Nitrite NEGATIVE  NEGATIVE    Leukocytes, UA NEGATIVE  NEGATIVE   URINE RAPID DRUG SCREEN (HOSP PERFORMED)     Status: Normal   Collection Time   02/02/11 12:40 PM      Component Value Range   Opiates NONE DETECTED  NONE DETECTED    Cocaine NONE DETECTED  NONE DETECTED    Benzodiazepines NONE  DETECTED  NONE DETECTED    Amphetamines NONE DETECTED  NONE DETECTED    Tetrahydrocannabinol NONE DETECTED  NONE DETECTED    Barbiturates NONE DETECTED  NONE DETECTED   RAPID STREP SCREEN     Status: Normal   Collection Time   02/02/11  2:30 PM      Component Value Range   Streptococcus, Group A Screen (Direct) NEGATIVE  NEGATIVE   COMPREHENSIVE METABOLIC PANEL     Status: Abnormal   Collection Time   02/03/11  5:10 AM      Component Value Range   Sodium 139  135 - 145 (mEq/L)   Potassium 3.3 (*) 3.5 - 5.1 (mEq/L)   Chloride 102  96 - 112 (mEq/L)   CO2 30  19 - 32 (mEq/L)   Glucose, Bld 96  70 - 99 (mg/dL)   BUN 8  6 - 23 (mg/dL)   Creatinine, Ser 0.45  0.50 - 1.10 (mg/dL)   Calcium 8.5  8.4 - 40.9 (mg/dL)   Total Protein 6.6  6.0 - 8.3 (g/dL)   Albumin 2.7 (*) 3.5 - 5.2 (g/dL)   AST 35  0 - 37 (U/L)   ALT 44 (*) 0 - 35 (U/L)   Alkaline Phosphatase 94  39 - 117 (U/L)   Total Bilirubin 0.4  0.3 - 1.2 (mg/dL)   GFR calc non Af Amer >90  >90 (mL/min)   GFR calc Af  Amer >90  >90 (mL/min)     Consults: pulmonary/intensive care for outpatient follow-up (Dr. Delford Field)  Significant Diagnostic Studies: labs: See above; Xray  Treatments: Diuretics  Discharge Exam: Blood pressure 125/80, pulse 102, temperature 98 F (36.7 C), temperature source Oral, resp. rate 22, height 5\' 5"  (1.651 m), weight 92.897 kg (204 lb 12.8 oz), SpO2 99.00%, unknown if currently breastfeeding. General appearance: alert Nose: Nares normal. Septum midline. Mucosa normal. No drainage or sinus tenderness., Small amount congestion Resp: clear to auscultation bilaterally Cardio: regular rate and rhythm, S1, S2 normal, no murmur, click, rub or gallop Extremities: edema 1+ pedal edema.  DTR 2+ without clonus Skin: Skin color, texture, turgor normal. No rashes or lesions O2 sat 99% on RA, no SOB on exertion. 1100 cc output overnight.  Disposition: Home or Self Care  Discharge Orders    Future Appointments:  Provider: Department: Dept Phone: Center:   02/04/2011 11:00 AM Shan Levans, MD [facility] [phone] None     Current Discharge Medication List    START taking these medications   Details  hydrochlorothiazide (HYDRODIURIL) 25 MG tablet Take 1 tablet (25 mg total) by mouth daily. Qty: 7 tablet, Refills: 0    potassium chloride SA (K-DUR,KLOR-CON) 20 MEQ tablet Take 1 tablet (20 mEq total) by mouth daily. Qty: 7 tablet, Refills: 0      CONTINUE these medications which have NOT CHANGED   Details  ibuprofen (ADVIL,MOTRIN) 600 MG tablet Take 1 tablet (600 mg total) by mouth every 6 (six) hours. Qty: 30 tablet, Refills: 2    norethindrone (ORTHO MICRONOR) 0.35 MG tablet Take 1 tablet (0.35 mg total) by mouth daily. Qty: 3 Package, Refills: 11    prenatal vitamin w/FE, FA (PRENATAL 1 + 1) 27-1 MG TABS Take 1 tablet by mouth daily.         Follow-up Information    Follow up with Shan Levans, MD on 02/04/2011.   Contact information:   [address] [facility] [address] [place] [address] [place] [address] [phone]          Signed: Nigel Bridgeman 02/03/2011, 10:46 AM

## 2011-02-03 NOTE — Progress Notes (Addendum)
Subjective: Patient awakened for eval, but still very sleepy.  Had been snoring very loudly before awakening.  On O2 per nasal cannula during night.  Still feels congested in chest  Objective: I have reviewed patient's labs and vital signs. Filed Vitals:   02/03/11 0425  BP: 126/84  Pulse: 95  Temp: 98.1 F (36.7 C)  Resp: 24     Results for orders placed during the hospital encounter of 02/02/11 (from the past 24 hour(s))  CBC     Status: Abnormal   Collection Time   02/02/11 11:50 AM      Component Value Range   WBC 9.6  4.0 - 10.5 (K/uL)   RBC 3.43 (*) 3.87 - 5.11 (MIL/uL)   Hemoglobin 10.1 (*) 12.0 - 15.0 (g/dL)   HCT 16.1 (*) 09.6 - 46.0 (%)   MCV 87.5  78.0 - 100.0 (fL)   MCH 29.4  26.0 - 34.0 (pg)   MCHC 33.7  30.0 - 36.0 (g/dL)   RDW 04.5  40.9 - 81.1 (%)   Platelets 253  150 - 400 (K/uL)  DIFFERENTIAL     Status: Normal   Collection Time   02/02/11 11:50 AM      Component Value Range   Neutrophils Relative 77  43 - 77 (%)   Neutro Abs 7.4  1.7 - 7.7 (K/uL)   Lymphocytes Relative 13  12 - 46 (%)   Lymphs Abs 1.2  0.7 - 4.0 (K/uL)   Monocytes Relative 7  3 - 12 (%)   Monocytes Absolute 0.7  0.1 - 1.0 (K/uL)   Eosinophils Relative 3  0 - 5 (%)   Eosinophils Absolute 0.3  0.0 - 0.7 (K/uL)   Basophils Relative 0  0 - 1 (%)   Basophils Absolute 0.0  0.0 - 0.1 (K/uL)  COMPREHENSIVE METABOLIC PANEL     Status: Abnormal   Collection Time   02/02/11 11:50 AM      Component Value Range   Sodium 136  135 - 145 (mEq/L)   Potassium 3.1 (*) 3.5 - 5.1 (mEq/L)   Chloride 102  96 - 112 (mEq/L)   CO2 26  19 - 32 (mEq/L)   Glucose, Bld 75  70 - 99 (mg/dL)   BUN 7  6 - 23 (mg/dL)   Creatinine, Ser 9.14 (*) 0.50 - 1.10 (mg/dL)   Calcium 8.5  8.4 - 78.2 (mg/dL)   Total Protein 6.0  6.0 - 8.3 (g/dL)   Albumin 2.6 (*) 3.5 - 5.2 (g/dL)   AST 40 (*) 0 - 37 (U/L)   ALT 32  0 - 35 (U/L)   Alkaline Phosphatase 91  39 - 117 (U/L)   Total Bilirubin 0.5  0.3 - 1.2 (mg/dL)   GFR calc  non Af Amer >90  >90 (mL/min)   GFR calc Af Amer >90  >90 (mL/min)  URINALYSIS, ROUTINE W REFLEX MICROSCOPIC     Status: Abnormal   Collection Time   02/02/11 12:40 PM      Component Value Range   Color, Urine YELLOW  YELLOW    Appearance HAZY (*) CLEAR    Specific Gravity, Urine 1.015  1.005 - 1.030    pH 7.5  5.0 - 8.0    Glucose, UA NEGATIVE  NEGATIVE (mg/dL)   Hgb urine dipstick NEGATIVE  NEGATIVE    Bilirubin Urine NEGATIVE  NEGATIVE    Ketones, ur NEGATIVE  NEGATIVE (mg/dL)   Protein, ur NEGATIVE  NEGATIVE (mg/dL)   Urobilinogen,  UA 0.2  0.0 - 1.0 (mg/dL)   Nitrite NEGATIVE  NEGATIVE    Leukocytes, UA NEGATIVE  NEGATIVE   URINE RAPID DRUG SCREEN (HOSP PERFORMED)     Status: Normal   Collection Time   02/02/11 12:40 PM      Component Value Range   Opiates NONE DETECTED  NONE DETECTED    Cocaine NONE DETECTED  NONE DETECTED    Benzodiazepines NONE DETECTED  NONE DETECTED    Amphetamines NONE DETECTED  NONE DETECTED    Tetrahydrocannabinol NONE DETECTED  NONE DETECTED    Barbiturates NONE DETECTED  NONE DETECTED   RAPID STREP SCREEN     Status: Normal   Collection Time   02/02/11  2:30 PM      Component Value Range   Streptococcus, Group A Screen (Direct) NEGATIVE  NEGATIVE   COMPREHENSIVE METABOLIC PANEL     Status: Abnormal   Collection Time   02/03/11  5:10 AM      Component Value Range   Sodium 139  135 - 145 (mEq/L)   Potassium 3.3 (*) 3.5 - 5.1 (mEq/L)   Chloride 102  96 - 112 (mEq/L)   CO2 30  19 - 32 (mEq/L)   Glucose, Bld 96  70 - 99 (mg/dL)   BUN 8  6 - 23 (mg/dL)   Creatinine, Ser 1.61  0.50 - 1.10 (mg/dL)   Calcium 8.5  8.4 - 09.6 (mg/dL)   Total Protein 6.6  6.0 - 8.3 (g/dL)   Albumin 2.7 (*) 3.5 - 5.2 (g/dL)   AST 35  0 - 37 (U/L)   ALT 44 (*) 0 - 35 (U/L)   Alkaline Phosphatase 94  39 - 117 (U/L)   Total Bilirubin 0.4  0.3 - 1.2 (mg/dL)   GFR calc non Af Amer >90  >90 (mL/min)   GFR calc Af Amer >90  >90 (mL/min)   General--very  sleepy Chest=--still has rhonchi in all lobes Heart--RRR without murmur, rate 95 Fundus firm, minimal lochia Urine output 1100 cc overnight. Pulse rate 95. Respiratory rate 24-26 Weight 204.8 (215.2) O2 sat 99% on Muse  Lab review:  ALT 44 (32)                     AST 35 (40)   Assessment/Plan: Continue current care at present. Evaluate respiratory status after patient more awake. Will evaluate best plan for pulmonary consult (inpatient vs. Outpatient) after consult with Dr. Normand Sloop.   Consider plan for d/c later today.   LOS: 1 day    Mary Chang 02/03/2011, 8:53 AM

## 2011-02-03 NOTE — Progress Notes (Signed)
UR chart review completed.  

## 2011-02-04 ENCOUNTER — Ambulatory Visit (HOSPITAL_BASED_OUTPATIENT_CLINIC_OR_DEPARTMENT_OTHER): Payer: Medicaid Other | Attending: Critical Care Medicine

## 2011-02-04 ENCOUNTER — Encounter: Payer: Self-pay | Admitting: Critical Care Medicine

## 2011-02-04 ENCOUNTER — Ambulatory Visit (INDEPENDENT_AMBULATORY_CARE_PROVIDER_SITE_OTHER): Payer: Medicaid Other | Admitting: Critical Care Medicine

## 2011-02-04 VITALS — BP 134/86 | HR 109 | Temp 98.1°F | Ht 65.0 in | Wt 207.8 lb

## 2011-02-04 DIAGNOSIS — E669 Obesity, unspecified: Secondary | ICD-10-CM | POA: Insufficient documentation

## 2011-02-04 DIAGNOSIS — G4733 Obstructive sleep apnea (adult) (pediatric): Secondary | ICD-10-CM

## 2011-02-04 DIAGNOSIS — J811 Chronic pulmonary edema: Secondary | ICD-10-CM | POA: Insufficient documentation

## 2011-02-04 DIAGNOSIS — I509 Heart failure, unspecified: Secondary | ICD-10-CM

## 2011-02-04 DIAGNOSIS — G4730 Sleep apnea, unspecified: Secondary | ICD-10-CM

## 2011-02-04 NOTE — Patient Instructions (Signed)
An echocardiogram will be obtained A sleep study will be obtained Return after above are obtained

## 2011-02-04 NOTE — Assessment & Plan Note (Signed)
Postpartum pulmonary edema with cardiomegaly and associated snoring with apneic spells The patient most likely has obstructive sleep apnea cannot rule out postpartum cardiomyopathy Plan Obtain echocardiogram Obtain full set of sleep studies When the results of the above are available further recommendations will follow

## 2011-02-04 NOTE — Progress Notes (Signed)
Subjective:    Patient ID: Mary Chang, female    DOB: 12-21-1990, 20 y.o.   MRN: [id number]  Shortness of Breath This is a new problem. The current episode started 1 to 4 weeks ago. The problem occurs constantly. The problem has been rapidly improving. Associated symptoms include leg swelling, orthopnea and PND. Pertinent negatives include no abdominal pain, chest pain, claudication, coryza, ear pain, fever, headaches, hemoptysis, leg pain, rhinorrhea, sore throat, sputum production, swollen glands, syncope, vomiting or wheezing. The symptoms are aggravated by lying flat and any activity. Treatments tried: lasix. The treatment provided moderate relief. Her past medical history is significant for a heart failure. There is no history of allergies, aspirin allergies, asthma, bronchiolitis, CAD, chronic lung disease, COPD, DVT, pneumonia or a recent surgery.    20 y.o. F  Post Hosp OV Pt seen as initial consult.  7days out from Vag delivery and d/c on 10/8, admit 10/7 Found to have pulm edema and ??sleep apnea.   D/c on hctz and KCL.  Since d/c is better with dyspnea.  Still with apnea during sleep, worse on back, if on side is ok Notes some edema but less with lasix .  No real chest pains.  Past Medical History  Diagnosis Date  . UTI (lower urinary tract infection)   . Sleep apnea      Family History  Problem Relation Age of Onset  . Hypertension Father   . Diabetes Father   . Heart disease Maternal Grandmother   . Hypertension Maternal Grandmother   . Diabetes Paternal Grandfather   . Hypertension Paternal Grandfather      History   Social History  . Marital Status: Single    Spouse Name: N/A    Number of Children: N/A  . Years of Education: N/A   Occupational History  . Not on file.   Social History Main Topics  . Smoking status: Current Some Day Smoker    Types: Cigarettes  . Smokeless tobacco: Never Used   Comment: Started smoking at age 58.  Currently smoking approx  2 cig/week  . Alcohol Use: No     states she used MJ on and off through 8th month for nausea  . Drug Use: Yes    Special: Marijuana     2 wks ago  . Sexually Active: Yes    Birth Control/ Protection: None   Other Topics Concern  . Not on file   Social History Narrative  . No narrative on file     No Known Allergies   Outpatient Prescriptions Prior to Visit  Medication Sig Dispense Refill  . ibuprofen (ADVIL,MOTRIN) 600 MG tablet Take 1 tablet (600 mg total) by mouth every 6 (six) hours.  30 tablet  2  . prenatal vitamin w/FE, FA (PRENATAL 1 + 1) 27-1 MG TABS Take 1 tablet by mouth daily.        . hydrochlorothiazide (HYDRODIURIL) 25 MG tablet Take 1 tablet (25 mg total) by mouth daily.  7 tablet  0  . norethindrone (ORTHO MICRONOR) 0.35 MG tablet Take 1 tablet (0.35 mg total) by mouth daily.  3 Package  11  . potassium chloride SA (K-DUR,KLOR-CON) 20 MEQ tablet Take 1 tablet (20 mEq total) by mouth daily.  7 tablet  0  . furosemide (LASIX) 10 MG/ML injection 40 mg       . hydrochlorothiazide (HYDRODIURIL) tablet 25 mg       . ibuprofen (ADVIL,MOTRIN) tablet 600 mg       .  lactated ringers infusion       . loratadine (CLARITIN) tablet 10 mg       . potassium chloride SA (K-DUR,KLOR-CON) CR tablet 20 mEq       . potassium chloride SA (K-DUR,KLOR-CON) CR tablet 20 mEq       . prenatal vitamin w/FE, FA (PRENATAL 1 + 1) 27-1 MG tablet 1 tablet           Review of Systems  Constitutional: Positive for fatigue. Negative for fever.  HENT: Negative for ear pain, congestion, sore throat, facial swelling, rhinorrhea and neck stiffness.   Respiratory: Positive for shortness of breath. Negative for cough, hemoptysis, sputum production, choking and wheezing.   Cardiovascular: Positive for orthopnea, leg swelling and PND. Negative for chest pain, claudication and syncope.  Gastrointestinal: Negative for vomiting and abdominal pain.  Neurological: Negative for headaches.         Objective:   Physical Exam Filed Vitals:   02/04/11 1132  BP: 134/86  Pulse: 109  Temp: 98.1 F (36.7 C)  TempSrc: Oral  Height: 5\' 5"  (1.651 m)  Weight: 207 lb 12.8 oz (94.257 kg)  SpO2: 98%    Gen: Pleasant, well-nourished, in no distress,  normal affect  ENT: No lesions,  mouth clear,  oropharynx clear, no postnasal drip  Neck: No JVD, no TMG, no carotid bruits  Lungs: No use of accessory muscles, no dullness to percussion, clear without rales or rhonchi  Cardiovascular: RRR, heart sounds normal, no murmur or gallops, 1+ peripheral edema  Abdomen: soft and NT, no HSM,  BS normal  Musculoskeletal: No deformities, no cyanosis or clubbing  Neuro: alert, non focal  Skin: Warm, no lesions or rashes    10/7 CT Chest Findings: No significant central or proximal hilar pulmonary  embolus identified by CTA. Limited evaluation of the peripheral  segmental branches to exclude small emboli. Pulsation artifact of  the intact thoracic aorta. Negative for aneurysm or dissection.  Residual thymic tissue in the anterior mediastinum. Heart size is  normal. No pericardial effusion. Trace pleural effusions are  noted dependently. No abnormal adenopathy. Engorgement of the  breast tissue bilaterally, related to postpartum state. Nodularity  in the right breast tissue noted, image 21.  Upper abdominal imaging demonstrates no acute process.  Lung windows demonstrate scattered patchy ground-glass opacities  predominately throughout the entire right lung involving all three  lobes and to a lesser degree in the left lower lobe. Appearance is  nonspecific but suspicious for asymmetric pulmonary edema, although  other considerations would include pulmonary hemorrhage, atypical  infection or hypersensitivity pneumonitis. Trachea and central  airways are patent. No bronchiectasis or mucous plugging.  No acute or abnormal osseous finding.  Review of the MIP images confirms the above  findings.  IMPRESSION:  Negative for significant central or proximal hilar pulmonary  embolus.  Residual prominent thymus in the anterior mediastinum  Trace pleural effusions with asymmetric nodular ground-glass  opacities, worse in the right lung consistent with asymmetric  pulmonary edema. Other considerations as above.  Postpartum breast engorgement bilaterally with some nodularity  present in the right breast tissue.      Assessment & Plan:   Sleep apnea Postpartum pulmonary edema with cardiomegaly and associated snoring with apneic spells The patient most likely has obstructive sleep apnea cannot rule out postpartum cardiomyopathy Plan Obtain echocardiogram Obtain full set of sleep studies When the results of the above are available further recommendations will follow    Updated Medication List Outpatient  Encounter Prescriptions as of 02/04/2011  Medication Sig Dispense Refill  . ibuprofen (ADVIL,MOTRIN) 600 MG tablet Take 1 tablet (600 mg total) by mouth every 6 (six) hours.  30 tablet  2  . prenatal vitamin w/FE, FA (PRENATAL 1 + 1) 27-1 MG TABS Take 1 tablet by mouth daily.        . hydrochlorothiazide (HYDRODIURIL) 25 MG tablet Take 1 tablet (25 mg total) by mouth daily.  7 tablet  0  . norethindrone (ORTHO MICRONOR) 0.35 MG tablet Take 1 tablet (0.35 mg total) by mouth daily.  3 Package  11  . potassium chloride SA (K-DUR,KLOR-CON) 20 MEQ tablet Take 1 tablet (20 mEq total) by mouth daily.  7 tablet  0  . DISCONTD: furosemide (LASIX) 10 MG/ML injection 40 mg       . DISCONTD: hydrochlorothiazide (HYDRODIURIL) tablet 25 mg       . DISCONTD: ibuprofen (ADVIL,MOTRIN) tablet 600 mg       . DISCONTD: lactated ringers infusion       . DISCONTD: loratadine (CLARITIN) tablet 10 mg       . DISCONTD: potassium chloride SA (K-DUR,KLOR-CON) CR tablet 20 mEq       . DISCONTD: potassium chloride SA (K-DUR,KLOR-CON) CR tablet 20 mEq       . DISCONTD: prenatal vitamin w/FE, FA  (PRENATAL 1 + 1) 27-1 MG tablet 1 tablet

## 2011-02-06 ENCOUNTER — Telehealth: Payer: Self-pay | Admitting: Critical Care Medicine

## 2011-02-06 ENCOUNTER — Telehealth: Payer: Self-pay | Admitting: Pulmonary Disease

## 2011-02-06 DIAGNOSIS — G4730 Sleep apnea, unspecified: Secondary | ICD-10-CM

## 2011-02-06 DIAGNOSIS — G4733 Obstructive sleep apnea (adult) (pediatric): Secondary | ICD-10-CM

## 2011-02-06 NOTE — Telephone Encounter (Signed)
Has severe obstructive sleep apnea  - stopped breathing 75/h Would suggest - start autoCPAP 5-15 cm, c flex +3, heated humidity with medium quattro mask Dr Delford Field - pl ok order if agree JR, pl arrange FU with me in next few weeks

## 2011-02-06 NOTE — Telephone Encounter (Signed)
Noted  

## 2011-02-06 NOTE — Telephone Encounter (Signed)
Severe sleep apnea on sleep study Rx: autoCPAP 5-15 cm, c flex +3, heated humidity with medium quattro mask F/u with Alva in sleep clinic

## 2011-02-06 NOTE — Procedures (Signed)
Mary Chang, Mary Chang              ACCOUNT NO.:  [id number]  MEDICAL RECORD NO.:  [id number]          PATIENT TYPE:  OUT  LOCATION:  SLEEP CENTER                 FACILITY:  [facility]  PHYSICIAN:  Oretha Milch, MD      DATE OF BIRTH:  01/29/1991  DATE OF STUDY:  02/04/2011                           NOCTURNAL POLYSOMNOGRAM  REFERRING PHYSICIAN:  Charlcie Cradle. Delford Field, MD, FCCP  INDICATION FOR STUDY:  Ms. Mary Chang is a 20 year old obese woman who presented with pulmonary edema 7 days after a vaginal delivery.  Apneas were witnessed during sleep, during on our hospital admission. At the time of this study, she weighed 207 pounds with a height of 5 feet and 5 inches, BMI of 34.  Neck size of 13 inch.  EPWORTH SLEEPINESS SCORE:  15.  MEDICATIONS:  None.  This nocturnal polysomnogram was performed with a sleep technologist in attendance.  EEG, EOG, EMG, EKG and respiratory parameters were recorded.  Sleep stages arousals, limb movement and respiratory data were scored according to criteria laid out by the American Academy of Sleep Medicine.  SLEEP ARCHITECTURE:  Lights out was at 11:08 p.m., lights on was at 5:10 a.m., total sleep time was 189 minutes with a sleep period time of 256 minutes and a sleep efficiency of 52%.  Sleep latency was 106 minutes. Latency to REM sleep was 185 minutes.  Wake after sleep onset at 68 minutes.  Sleep stages of the percentage of total sleep time was N1 12%, N2 62%, N3 12%, and REM sleep 14% (26 minutes).  Longest period of REM sleep around 4 a.m., supine sleep accounted for 11 minutes, supine REM rebound was not noted.  AROUSAL DATA:  There were total of 88 arousals with an arousal index of 20 events per hour.  Of these, 42 were spontaneous and the rest were associated with respiratory event.  RESPIRATORY DATA:  There were total of 49 obstructive apneas, 1 central apnea, 1 mixed apneas, and 186 hypopneas and apnea/hypopnea index of 75 events per hour, 35  RERAs were noted with an RDI of 86 events per hour. The longest apnea was 23 seconds and the longest hypopnea was 38 seconds.  The respiratory disturbance was worst during REM sleep.  OXYGEN DATA:  The desaturation index was 75 events per hour.  The lowest desaturation was 76% with 69% during REM sleep and 76% during non-REM sleep.  She spent 17 minutes with a saturation less than 88%.  CARDIAC DATA:  The low heart rate was 40 beats per minute.  The high heart rate recorded was an artifact.  No arrhythmias were noted.  MOVEMENT-PARASOMNIA:  No significant limb movements were noted.  DISCUSSION:  She was desensitized with a medium Quattro full-face mask, but due to her prolonged sleep latency, did not have sufficient events in the first half of the night for CPAP to be initiated.  IMPRESSIONS-RECOMMENDATIONS: 1. Severe obstructive sleep apnea with hypopneas causing oxygen     desaturation and sleep fragmentation. 2. No evidence of cardiac arrhythmias, limb movements, or behavioral     disturbance during sleep.  RECOMMENDATION: 1. Treatment options for this degree of sleep disorder breathing  include weight loss and CPAP therapy. 2. A CPAP titration study can be performed if she has significant     cardiopulmonary comorbidities alternatively and auto CPAP of 5-7 cm     with a medium nasal mask can be tried at home with a download     obtained in 3-4 weeks to decide on the fixed pressure of CPAP. 3. She should be advised against medication with sedative side     effects.  She should be cautioned against driving when sleepy.     Oretha Milch, MD Electronically Signed    RVA/MEDQ  D:  02/06/2011 12:50:47  T:  02/06/2011 21:40:47  Job:  [id number]  cc:   Charlcie Cradle. Delford Field, MD, FCCP [address] [place] [address]

## 2011-02-06 NOTE — Telephone Encounter (Signed)
Left message with family member for pt to return call. PW has already placed order.

## 2011-02-07 ENCOUNTER — Other Ambulatory Visit (HOSPITAL_COMMUNITY): Payer: Medicaid Other | Admitting: Radiology

## 2011-02-10 ENCOUNTER — Ambulatory Visit (HOSPITAL_COMMUNITY): Payer: Medicaid Other | Attending: Critical Care Medicine | Admitting: Radiology

## 2011-02-10 DIAGNOSIS — I509 Heart failure, unspecified: Secondary | ICD-10-CM | POA: Insufficient documentation

## 2011-02-10 DIAGNOSIS — R0989 Other specified symptoms and signs involving the circulatory and respiratory systems: Secondary | ICD-10-CM | POA: Insufficient documentation

## 2011-02-10 DIAGNOSIS — R0609 Other forms of dyspnea: Secondary | ICD-10-CM | POA: Insufficient documentation

## 2011-02-10 DIAGNOSIS — G4730 Sleep apnea, unspecified: Secondary | ICD-10-CM | POA: Insufficient documentation

## 2011-02-10 DIAGNOSIS — I517 Cardiomegaly: Secondary | ICD-10-CM | POA: Insufficient documentation

## 2011-02-10 DIAGNOSIS — F17200 Nicotine dependence, unspecified, uncomplicated: Secondary | ICD-10-CM | POA: Insufficient documentation

## 2011-02-10 DIAGNOSIS — R5381 Other malaise: Secondary | ICD-10-CM | POA: Insufficient documentation

## 2011-02-12 NOTE — Telephone Encounter (Signed)
Pt scheduled 03-21-11 at 4pm

## 2011-02-26 ENCOUNTER — Ambulatory Visit: Payer: Medicaid Other | Admitting: Critical Care Medicine

## 2011-02-26 ENCOUNTER — Encounter: Payer: Self-pay | Admitting: Pulmonary Disease

## 2011-03-21 ENCOUNTER — Ambulatory Visit: Payer: Medicaid Other | Admitting: Pulmonary Disease

## 2011-03-31 ENCOUNTER — Institutional Professional Consult (permissible substitution): Payer: Medicaid Other | Admitting: Pulmonary Disease

## 2011-11-04 IMAGING — CT CT ANGIO CHEST
1 of 2 series · 19 of 32 positions shown · IV contrast (OMNIPAQUE)
Comparison: 02/02/2011 chest x-ray

CLINICAL DATA: Shortness of breath, cough, recent vaginal delivery
6 days ago

CT ANGIOGRAPHY CHEST WITH CONTRAST
TECHNIQUE: Multidetector CT imaging of the chest was performed
using the standard protocol during bolus administration of
intravenous contrast.  Multiplanar CT image reconstructions
including MIPs were obtained to evaluate the vascular anatomy.
Contrast:  100 ml Hmnipaque-JUU

[Series 10: (person_name) thins · axial · 0.61mm/px · z∈[-118,+132]mm · 19 of 398 slices shown]
[im 20/398  lung]
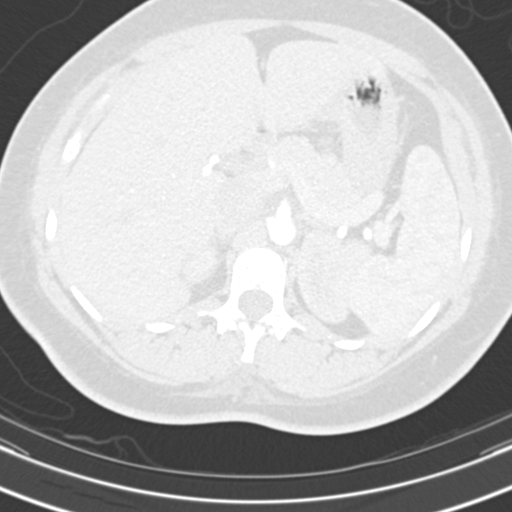
[im 40/398  mediastinal]
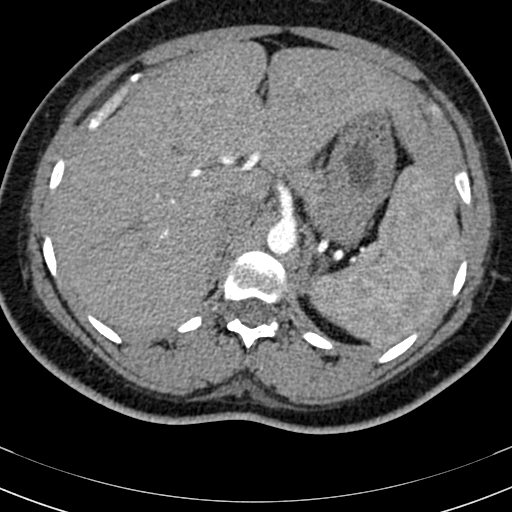
[im 60/398  lung]
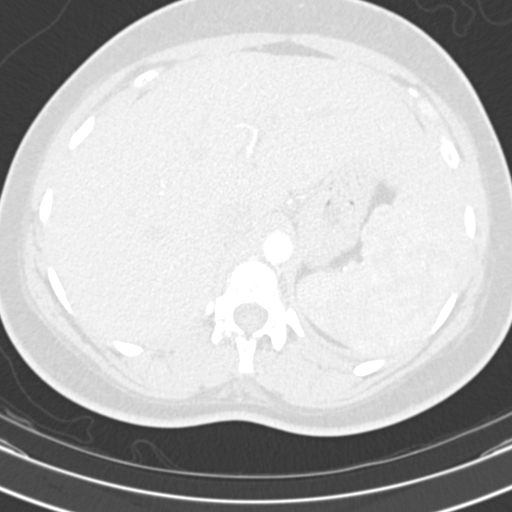
[im 100/398  mediastinal]
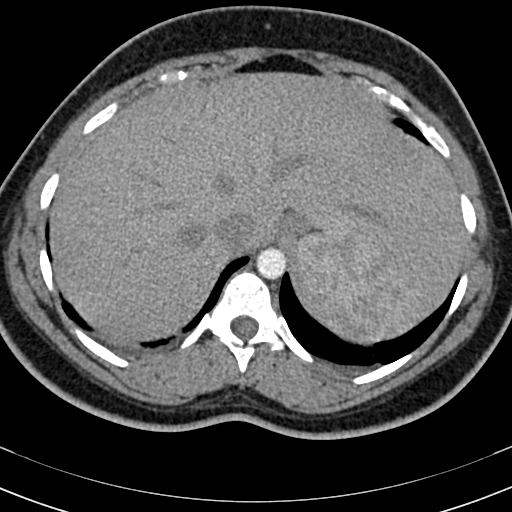
[im 120/398  lung]
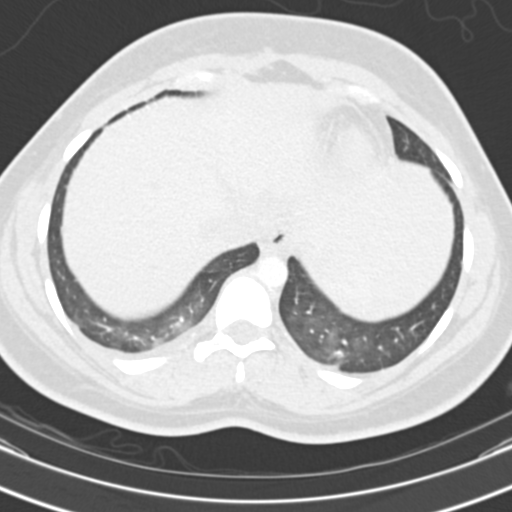
[im 133/398  mediastinal]
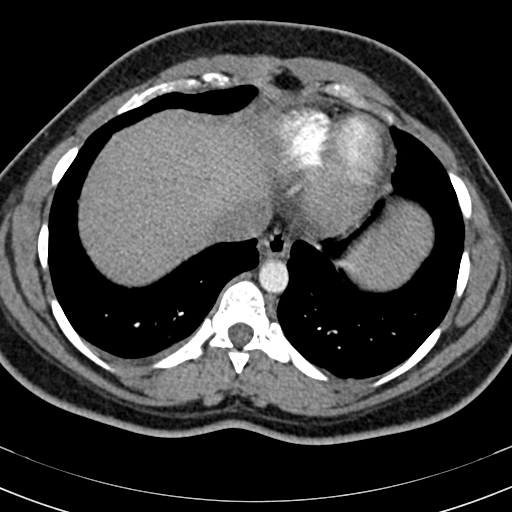
[im 139/398  lung]
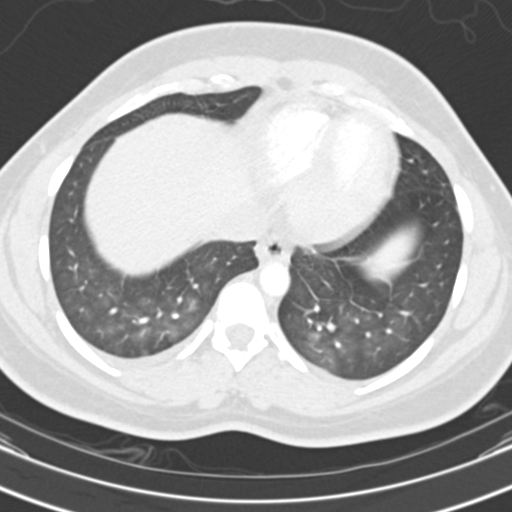
[im 159/398  mediastinal]
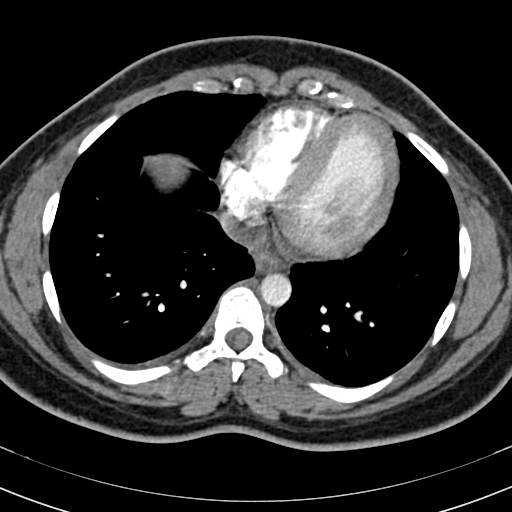
[im 179/398  lung]
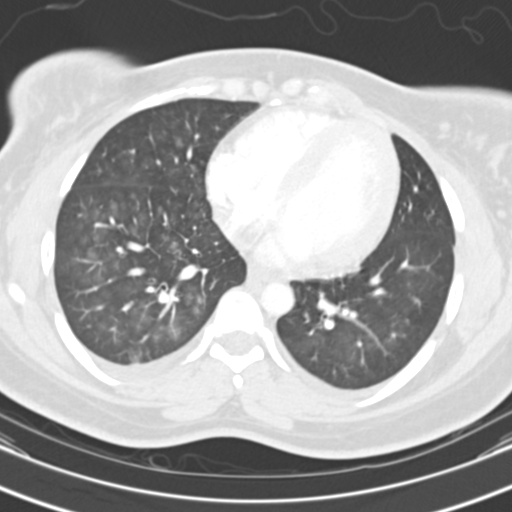
[im 199/398  mediastinal]
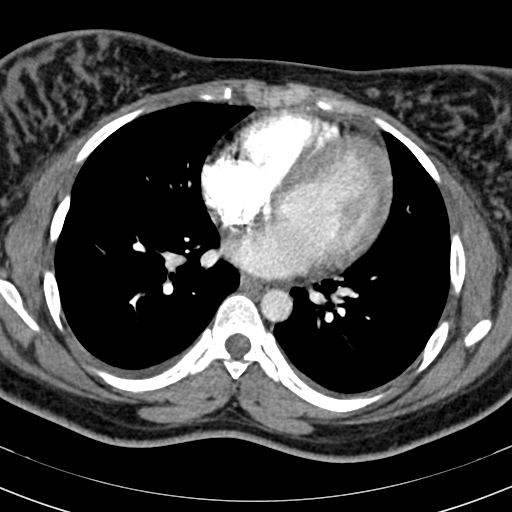
[im 219/398  lung]
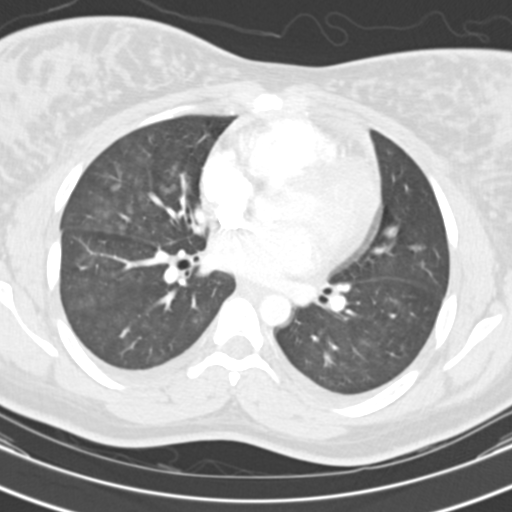
[im 239/398  mediastinal]
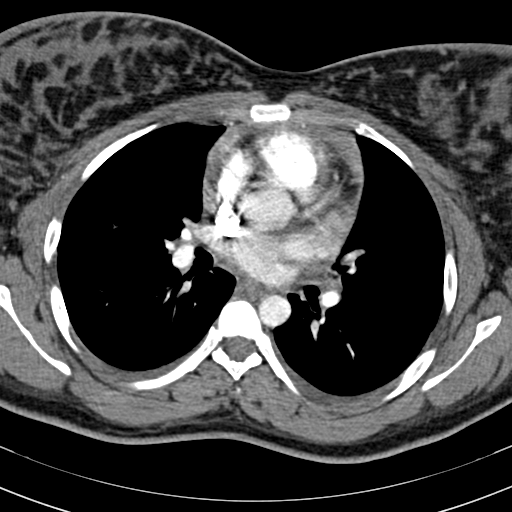
[im 259/398  lung]
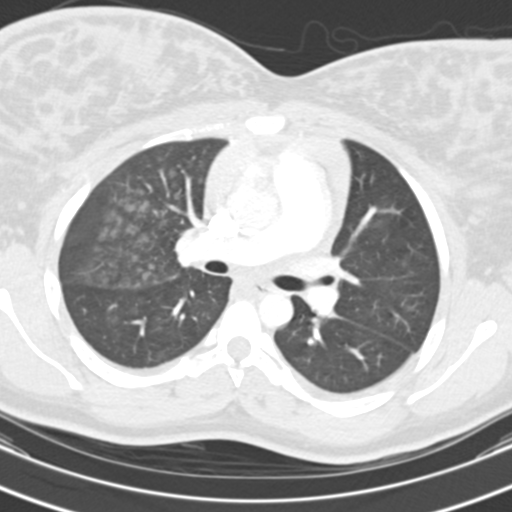
[im 265/398  mediastinal]
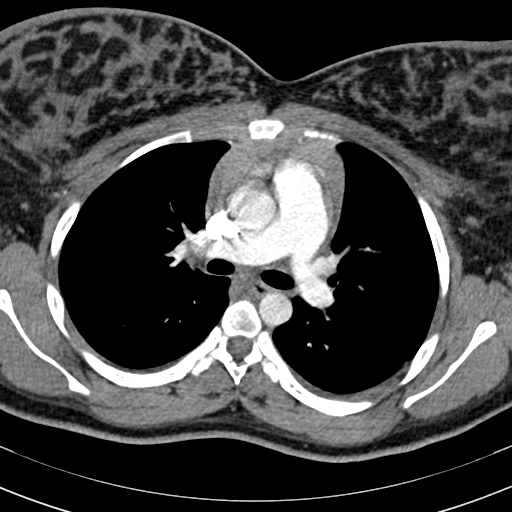
[im 278/398  lung]
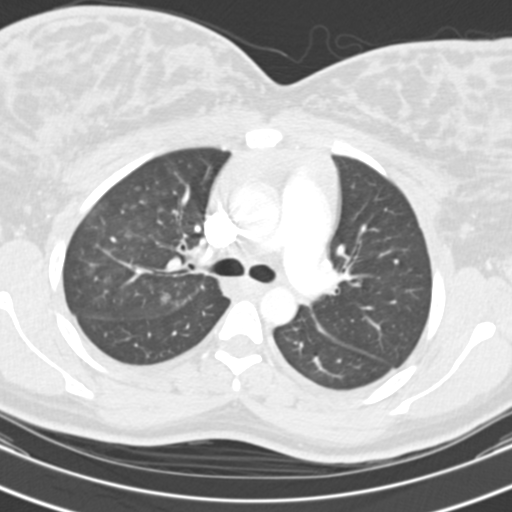
[im 298/398  mediastinal]
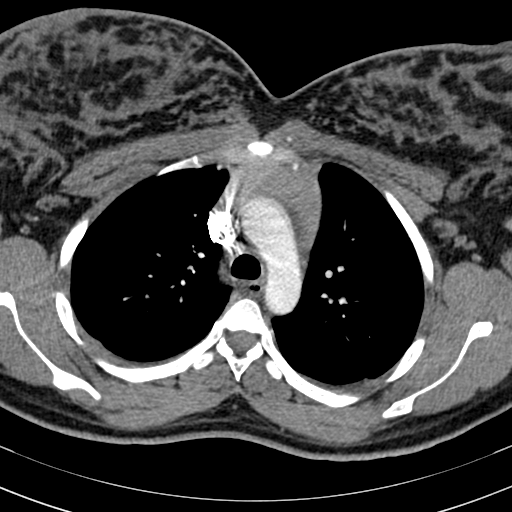
[im 338/398  lung]
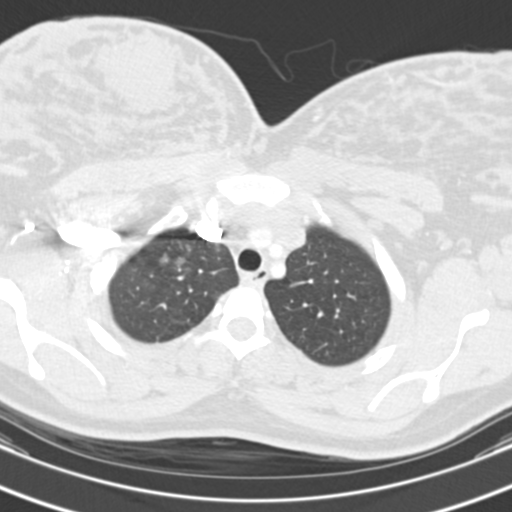
[im 358/398  mediastinal]
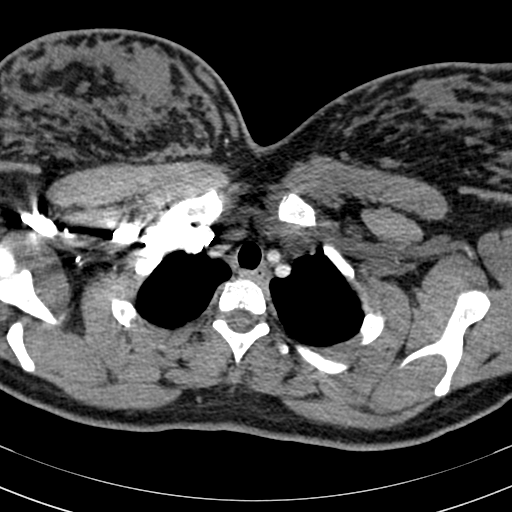
[im 378/398  lung]
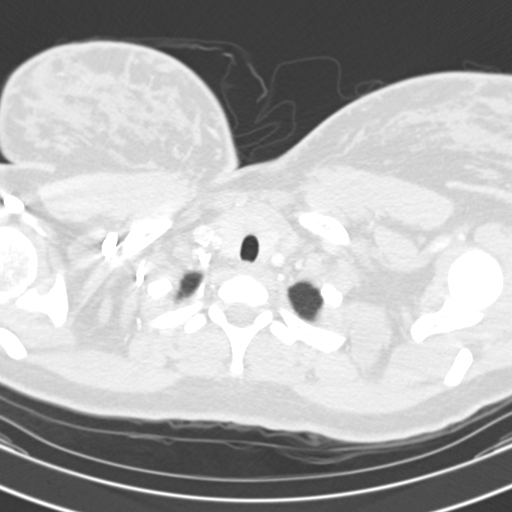

[19 of 32 positions shown; findings below may reference images not displayed]

FINDINGS: No significant central or proximal hilar pulmonary
embolus identified by CTA.  Limited evaluation of the peripheral
segmental branches to exclude small emboli.  Pulsation artifact of
the intact thoracic aorta.  Negative for aneurysm or dissection.
Residual thymic tissue in the anterior mediastinum.  Heart size is
normal.  No pericardial effusion.  Trace pleural effusions are
noted dependently.  No abnormal adenopathy.  Engorgement of the
breast tissue bilaterally, related to postpartum state.  Nodularity
in the right breast tissue noted, image 21.

Upper abdominal imaging demonstrates no acute process.

Lung windows demonstrate scattered patchy ground-glass opacities
predominately throughout the entire right lung involving all three
lobes and to a lesser degree in the left lower lobe.  Appearance is
nonspecific but suspicious for asymmetric pulmonary edema, although
other considerations would include pulmonary hemorrhage, atypical
infection or hypersensitivity pneumonitis.  Trachea and central
airways are patent.  No bronchiectasis or mucous plugging.

No acute or abnormal osseous finding.

Review of the MIP images confirms the above findings.
IMPRESSION: Negative for significant central or proximal hilar pulmonary
embolus.

Residual prominent thymus in the anterior mediastinum

Trace pleural effusions with asymmetric nodular ground-glass
opacities, worse in the right lung consistent with asymmetric
pulmonary edema.  Other considerations as above.

Postpartum breast engorgement bilaterally with some nodularity
present in the right breast tissue.

## 2013-04-28 NOTE — L&D Delivery Note (Signed)
Called to room stat due to baby crowning and pt unable to control urge to push. Arrived to room and infant on maternal abdomen.  Delivery Note At 10:15 PM a viable female "Mary Chang" was delivered precipitously via Vaginal, Spontaneous Delivery (Presentation: Right Occiput Anterior).  APGAR: 9, 9; weight 6lbs 4.5 oz (2849 g).   Placenta status: Intact, Spontaneous.  Cord: 3 vessels with the following complications: None.  Cord pH: NA.  Anesthesia: None.  Episiotomy: None. Lacerations: Very small 1st degree vaginal lac -only one stitch needed to achieve hemostasis. Suture Repair: 3.0 vicryl. Est. Blood Loss (mL): 450 - several clots manually expressed immediately after delivery and bladder emptied via [place] (approx 100 ml). IV infiltrated just prior to delivery therefore, 10 U Pitocin IM given. During repair, I continued to express clots and noted heavy bleeding, so 1000 mcg Cytotec was administered pr. Bleeding slowed by the end of repair.   Approximately 30 min later, call received from RN that pt continued to have trickle with sizeable clots. Fundus assessed at firm at U+1. Bleeding noted to be mod-large, clots noted. Ordered IV to be restarted and Pitocin per postpartum protocol. Pt instructed to empty her bladder after IV start/infusion of Pitocin.   Pt was able to get up and void 400 ml. Fundus re-evaluated, now U-1, and bleeding scant to small.   Closely observed pt after all interventions and at no time did she become dizzy, febrile or have chills. No c/o faintness reported. All clots less than the size of a golf ball.   Mom to postpartum.  Baby to Couplet care / Skin to Skin.  Mom breastfeeding.  Undecided about birth control.  Mary Chang 01/10/2014, 12:14 AM

## 2013-06-19 ENCOUNTER — Emergency Department (HOSPITAL_COMMUNITY)
Admission: EM | Admit: 2013-06-19 | Discharge: 2013-06-19 | Disposition: A | Discharge status: Home / Self Care | Payer: Medicaid Other | Attending: Emergency Medicine | Admitting: Emergency Medicine

## 2013-06-19 ENCOUNTER — Encounter (HOSPITAL_COMMUNITY): Payer: Self-pay | Admitting: Emergency Medicine

## 2013-06-19 DIAGNOSIS — O99330 Smoking (tobacco) complicating pregnancy, unspecified trimester: Secondary | ICD-10-CM | POA: Insufficient documentation

## 2013-06-19 DIAGNOSIS — J029 Acute pharyngitis, unspecified: Secondary | ICD-10-CM | POA: Insufficient documentation

## 2013-06-19 DIAGNOSIS — J111 Influenza due to unidentified influenza virus with other respiratory manifestations: Secondary | ICD-10-CM

## 2013-06-19 DIAGNOSIS — O9989 Other specified diseases and conditions complicating pregnancy, childbirth and the puerperium: Secondary | ICD-10-CM | POA: Insufficient documentation

## 2013-06-19 DIAGNOSIS — Z3490 Encounter for supervision of normal pregnancy, unspecified, unspecified trimester: Secondary | ICD-10-CM

## 2013-06-19 DIAGNOSIS — O219 Vomiting of pregnancy, unspecified: Secondary | ICD-10-CM | POA: Insufficient documentation

## 2013-06-19 DIAGNOSIS — R69 Illness, unspecified: Secondary | ICD-10-CM

## 2013-06-19 DIAGNOSIS — Z87440 Personal history of urinary (tract) infections: Secondary | ICD-10-CM | POA: Insufficient documentation

## 2013-06-19 DIAGNOSIS — Z79899 Other long term (current) drug therapy: Secondary | ICD-10-CM | POA: Insufficient documentation

## 2013-06-19 LAB — POC URINE PREG, ED: PREG TEST UR: POSITIVE — AB

## 2013-06-19 MED ORDER — TRAMADOL HCL 50 MG PO TABS: 50.0000 mg | ORAL_TABLET | Freq: Four times a day (QID) | ORAL | Status: DC | PRN

## 2013-06-19 MED ORDER — ONDANSETRON 4 MG PO TBDP
4.0000 mg | ORAL_TABLET | Freq: Once | ORAL | Status: AC
  Administered 2013-06-19: 4 mg via ORAL
  Filled 2013-06-19: qty 1

## 2013-06-19 MED ORDER — PROMETHAZINE HCL 25 MG PO TABS: 25.0000 mg | ORAL_TABLET | Freq: Four times a day (QID) | ORAL | Status: DC | PRN

## 2013-06-19 MED ORDER — TRAMADOL HCL 50 MG PO TABS
50.0000 mg | ORAL_TABLET | Freq: Once | ORAL | Status: AC
  Administered 2013-06-19: 50 mg via ORAL
  Filled 2013-06-19: qty 1

## 2013-06-19 MED ORDER — ONDANSETRON HCL 4 MG PO TABS: 4.0000 mg | ORAL_TABLET | Freq: Four times a day (QID) | ORAL | Status: DC

## 2013-06-19 NOTE — ED Notes (Signed)
Pt requesting pregnancy test.  PA aware, orders for same.

## 2013-06-19 NOTE — ED Notes (Addendum)
C/o multiple flu like sx: cough, nv, (describes as post tussive emesis) body aches, sore throat, nasal congestion, onset Thursday. Alert, NAD, calm, interactive. No meds PTA. LMP 04/28/13. LS CTA. Throat/tonsils red & mildly swollen.

## 2013-06-19 NOTE — ED Provider Notes (Signed)
Medical screening examination/treatment/procedure(s) were performed by non-physician practitioner and as supervising physician I was immediately available for consultation/collaboration.  EKG Interpretation   None       Devoria AlbeIva Kenard Morawski, MD, Armando GangFACEP   Ward GivensIva L Jeramine Delis, MD 06/19/13 2233

## 2013-06-19 NOTE — Discharge Instructions (Signed)

## 2013-06-19 NOTE — ED Provider Notes (Signed)
CSN: [id number]     Arrival date & time 06/19/13  2137 History  This chart was scribed for non-physician practitioner Marlon Pel working with Ward Givens, MD by Carl Best, ED Scribe. This patient was seen in room TR07C/TR07C and the patient's care was started at 10:18 PM.     Chief Complaint  Patient presents with  . Emesis  . Generalized Body Aches  . Sore Throat  . Nasal Congestion  . Cough     Patient is a 23 y.o. female presenting with vomiting, pharyngitis, and cough. The history is provided by the patient. No language interpreter was used.  Emesis Associated symptoms: myalgias and sore throat   Associated symptoms: no diarrhea   Sore Throat  Cough Associated symptoms: myalgias and sore throat    HPI Comments: Mary Chang is a 23 y.o. female who presents to the Emergency Department complaining of constant myalgias and vomiting that started three days ago.  The patient lists associated sore throat, mild cough, decreased energy, and nasal congestion as associated symptoms.  The patient states that she is unable to hold any food down.  She denies diarrhea as an associated symptom.  The patient denies taking any medication to treat her symptoms.  She denies sick contacts.  The patient denies having received a flu shot this year.  The patient states her LNMP was April 28, 2013 and is concerned that she may be pregnant.  She denies taking a pregnancy test recently.     Past Medical History  Diagnosis Date  . UTI (lower urinary tract infection)   . Sleep apnea    Past Surgical History  Procedure Laterality Date  . No past surgeries     Family History  Problem Relation Age of Onset  . Hypertension Father   . Diabetes Father   . Heart disease Maternal Grandmother   . Hypertension Maternal Grandmother   . Diabetes Paternal Grandfather   . Hypertension Paternal Grandfather    History  Substance Use Topics  . Smoking status: Current Some Day Smoker    Types:  Cigarettes  . Smokeless tobacco: Never Used     Comment: Started smoking at age 34.  Currently smoking approx 2 cig/week  . Alcohol Use: No     Comment: states she used MJ on and off through 8th month for nausea   OB History   Grav Para Term Preterm Abortions TAB SAB Ect Mult Living   2 1 1  0 1 1 0 0 0 1     Review of Systems  Constitutional: Positive for fatigue.  HENT: Positive for congestion and sore throat.   Respiratory: Positive for cough.   Gastrointestinal: Positive for vomiting. Negative for diarrhea.  Musculoskeletal: Positive for myalgias.  All other systems reviewed and are negative.      Allergies  Review of patient's allergies indicates no known allergies.  Home Medications   Current Outpatient Rx  Name  Route  Sig  Dispense  Refill  . norethindrone-ethinyl estradiol (NECON,BREVICON,MODICON) 0.5-35 MG-MCG tablet   Oral   Take 1 tablet by mouth daily.         . promethazine (PHENERGAN) 25 MG tablet   Oral   Take 1 tablet (25 mg total) by mouth every 6 (six) hours as needed for nausea or vomiting.   30 tablet   0   . traMADol (ULTRAM) 50 MG tablet   Oral   Take 1 tablet (50 mg total) by mouth every 6 (six) hours  as needed.   15 tablet   0    Triage Vitals: BP 119/63  Pulse 110  Temp(Src) 98.2 F (36.8 C) (Oral)  Resp 16  Ht 5\' 4"  (1.626 m)  Wt 183 lb (83.008 kg)  BMI 31.40 kg/m2  SpO2 100%  LMP 04/28/2013  Physical Exam  Nursing note and vitals reviewed. Constitutional: She is oriented to person, place, and time. She appears well-developed and well-nourished.  HENT:  Head: Normocephalic and atraumatic.  Eyes: EOM are normal.  Neck: Normal range of motion.  Cardiovascular: Normal rate.   Pulmonary/Chest: Effort normal.  Musculoskeletal: Normal range of motion.  Neurological: She is alert and oriented to person, place, and time.  Skin: Skin is warm and dry.  Psychiatric: She has a normal mood and affect. Her behavior is normal.     ED Course  Procedures (including critical care time)  DIAGNOSTIC STUDIES: Oxygen Saturation is 100% on room air, normal by my interpretation.    COORDINATION OF CARE: 10:21 PM- Discussed a clinical suspicion of the flu.  Advised the patient to treat her symptoms.  Discussed discharging the patient with a prescription for Ultram and nausea medication.  Advised the patient to get a pregnancy test to rule out the suspicion of a possible pregnancy.  The patient agreed to the treatment plan.    Labs Review Labs Reviewed - No data to display Imaging Review No results found.  EKG Interpretation   None       MDM   Final diagnoses:  Influenza-like illness   Normal physical exam. Pts most bothersome symptoms are the body aches and the post tussive vomiting. Will rx Phenergan and Ultram. Pt advised to see PCP.  22 y.o.Mary Chang's evaluation in the Emergency Department is complete. It has been determined that no acute conditions requiring further emergency intervention are present at this time. The patient/guardian have been advised of the diagnosis and plan. We have discussed signs and symptoms that warrant return to the ED, such as changes or worsening in symptoms.  Vital signs are stable at discharge. Filed Vitals:   06/19/13 2141  BP: 119/63  Pulse: 110  Temp: 98.2 F (36.8 C)  Resp: 16    Patient/guardian has voiced understanding and agreed to follow-up with the PCP or specialist.  I personally performed the services described in this documentation, which was scribed in my presence. The recorded information has been reviewed and is accurate.    Dorthula Matasiffany G Liliana Brentlinger, PA-C 06/19/13 2231

## 2013-07-12 ENCOUNTER — Inpatient Hospital Stay (HOSPITAL_COMMUNITY)
Admission: AD | Admit: 2013-07-12 | Discharge: 2013-07-12 | Disposition: A | Discharge status: Home / Self Care | Payer: Medicaid Other | Source: Ambulatory Visit | Attending: Family Medicine | Admitting: Family Medicine

## 2013-07-12 ENCOUNTER — Encounter (HOSPITAL_COMMUNITY): Payer: Self-pay | Admitting: *Deleted

## 2013-07-12 DIAGNOSIS — O210 Mild hyperemesis gravidarum: Secondary | ICD-10-CM

## 2013-07-12 DIAGNOSIS — K5900 Constipation, unspecified: Secondary | ICD-10-CM

## 2013-07-12 DIAGNOSIS — O99891 Other specified diseases and conditions complicating pregnancy: Secondary | ICD-10-CM

## 2013-07-12 DIAGNOSIS — O9989 Other specified diseases and conditions complicating pregnancy, childbirth and the puerperium: Secondary | ICD-10-CM

## 2013-07-12 DIAGNOSIS — O219 Vomiting of pregnancy, unspecified: Secondary | ICD-10-CM

## 2013-07-12 DIAGNOSIS — O99611 Diseases of the digestive system complicating pregnancy, first trimester: Secondary | ICD-10-CM

## 2013-07-12 LAB — URINALYSIS, ROUTINE W REFLEX MICROSCOPIC
Bilirubin Urine: NEGATIVE
Glucose, UA: NEGATIVE mg/dL
Ketones, ur: NEGATIVE mg/dL
Leukocytes, UA: NEGATIVE
NITRITE: NEGATIVE
PROTEIN: NEGATIVE mg/dL
UROBILINOGEN UA: 0.2 mg/dL (ref 0.0–1.0)
pH: 6 (ref 5.0–8.0)

## 2013-07-12 LAB — URINE MICROSCOPIC-ADD ON

## 2013-07-12 MED ORDER — PROMETHAZINE HCL 25 MG PO TABS: 25.0000 mg | ORAL_TABLET | Freq: Once | ORAL | Status: DC

## 2013-07-12 MED ORDER — PROMETHAZINE HCL 25 MG PO TABS: 25.0000 mg | ORAL_TABLET | Freq: Four times a day (QID) | ORAL | Status: DC | PRN

## 2013-07-12 NOTE — Discharge Instructions (Signed)
Morning Sickness Morning sickness is when you feel sick to your stomach (nauseous) during pregnancy. This nauseous feeling may or may not come with vomiting. It often occurs in the morning but can be a problem any time of day. Morning sickness is most common during the first trimester, but it may continue throughout pregnancy. While morning sickness is unpleasant, it is usually harmless unless you develop severe and continual vomiting (hyperemesis gravidarum). This condition requires more intense treatment.  CAUSES  The cause of morning sickness is not completely known but seems to be related to normal hormonal changes that occur in pregnancy. RISK FACTORS You are at greater risk if you:  Experienced nausea or vomiting before your pregnancy.  Had morning sickness during a previous pregnancy.  Are pregnant with more than one baby, such as twins. TREATMENT  Do not use any medicines (prescription, over-the-counter, or herbal) for morning sickness without first talking to your health care provider. Your health care provider may prescribe or recommend:  Vitamin B6 supplements twice a day. Unisom (Doxalamine) once or twice a day.   Anti-nausea medicines.  The herbal medicine ginger. HOME CARE INSTRUCTIONS   Only take over-the-counter or prescription medicines as directed by your health care provider.  Taking multivitamins before getting pregnant can prevent or decrease the severity of morning sickness in most women.   Eat a piece of dry toast or unsalted crackers before getting out of bed in the morning.   Eat five or six small meals a day.   Eat dry and bland foods (rice, baked potato). Foods high in carbohydrates are often helpful.  Do not drink liquids with your meals. Drink liquids between meals.   Avoid greasy, fatty, and spicy foods.   Get someone to cook for you if the smell of any food causes nausea and vomiting.   If you feel nauseous after taking prenatal vitamins,  take the vitamins at night or with a snack.  Snack on protein foods (nuts, yogurt, cheese) between meals if you are hungry.   Eat unsweetened gelatins for desserts.   Wearing an acupressure wristband (worn for sea sickness) may be helpful.   Acupuncture may be helpful.   Do not smoke.   Get a humidifier to keep the air in your house free of odors.   Get plenty of fresh air. SEEK MEDICAL CARE IF:   Your home remedies are not working, and you need medicine.  You feel dizzy or lightheaded.  You are losing weight. SEEK IMMEDIATE MEDICAL CARE IF:   You have persistent and uncontrolled nausea and vomiting.  You pass out (faint). Document Released: 06/05/2006 Document Revised: 12/15/2012 Document Reviewed: 09/29/2012 [facility] Patient Information 2014 [place].  Hyperemesis Gravidarum Diet Hyperemesis gravidarum is a severe form of morning sickness. It is characterized by frequent and severe vomiting. It happens during the first trimester of pregnancy. It may be caused by the rapid hormone changes that happen during pregnancy. It is associated with a 5% weight loss of pre-pregnancy weight. The hyperemesis diet may be used to lessen symptoms of nausea and vomiting. EATING GUIDELINES  Eat 5 to 6 small meals daily instead of 3 large meals.  Avoid foods with strong smells.  Avoid drinking 30 minutes before and after meals.  Avoid fried or high-fat foods, such as butter and cream sauces.  Starchy foods are usually well-tolerated, such as cereal, toast, bread, potatoes, pasta, rice, and pretzels.  Eat crackers before you get out of bed in the morning.  Avoid  spicy foods.  Ginger may help with nausea. Add  tsp ginger to hot tea or choose ginger tea.  Continue to take your prenatal vitamins as directed by your caregiver. SAMPLE MEAL PLAN Breakfast    cup oatmeal  1 slice toast  1 tsp heart-healthy margarine  1 tsp jelly  1 scrambled egg Midmorning Snack     1 cup low-fat yogurt Lunch   Plain ham sandwich  Carrot or celery sticks  1 small apple  3 graham crackers Midafternoon Snack   Cheese and crackers Dinner  4 oz pork tenderloin  1 small baked potato  1 tsp margarine   cup broccoli   cup grapes Evening Snack  1 cup pudding Document Released: 02/09/2007 Document Revised: 07/07/2011 Document Reviewed: 09/14/2012 [employer] Patient Information 2014 [place], [place].  Pregnancy - First Trimester During sexual intercourse, millions of sperm go into the vagina. Only 1 sperm will penetrate and fertilize the female egg while it is in the Fallopian tube. One week later, the fertilized egg implants into the wall of the uterus. An embryo begins to develop into a baby. At 6 to 8 weeks, the eyes and face are formed and the heartbeat can be seen on ultrasound. At the end of 12 weeks (first trimester), all the baby's organs are formed. Now that you are pregnant, you will want to do everything you can to have a healthy baby. Two of the most important things are to get good prenatal care and follow your caregiver's instructions. Prenatal care is all the medical care you receive before the baby's birth. It is given to prevent, find, and treat problems during the pregnancy and childbirth. PRENATAL EXAMS  During prenatal visits, your weight, blood pressure, and urine are checked. This is done to make sure you are healthy and progressing normally during the pregnancy.  A pregnant woman should gain 25 to 35 pounds during the pregnancy. However, if you are overweight or underweight, your caregiver will advise you regarding your weight.  Your caregiver will ask and answer questions for you.  Blood work, cervical cultures, other necessary tests, and a Pap test are done during your prenatal exams. These tests are done to check on your health and the probable health of your baby. Tests are strongly recommended and done for HIV with your permission.  This is the virus that causes AIDS. These tests are done because medicines can be given to help prevent your baby from being born with this infection should you have been infected without knowing it. Blood work is also used to find out your blood type, previous infections, and follow your blood levels (hemoglobin).  Low hemoglobin (anemia) is common during pregnancy. Iron and vitamins are given to help prevent this. Later in the pregnancy, blood tests for diabetes will be done along with any other tests if any problems develop.  You may need other tests to make sure you and the baby are doing well. CHANGES DURING THE FIRST TRIMESTER  Your body goes through many changes during pregnancy. They vary from person to person. Talk to your caregiver about changes you notice and are concerned about. Changes can include:  Your menstrual period stops.  The egg and sperm carry the genes that determine what you look like. Genes from you and your partner are forming a baby. The female genes determine whether the baby is a boy or a girl.  Your body increases in girth and you may feel bloated.  Feeling sick to your stomach (nauseous)  and throwing up (vomiting). If the vomiting is uncontrollable, call your caregiver.  Your breasts will begin to enlarge and become tender.  Your nipples may stick out more and become darker.  The need to urinate more. Painful urination may mean you have a bladder infection.  Tiring easily.  Loss of appetite.  Cravings for certain kinds of food.  At first, you may gain or lose a couple of pounds.  You may have changes in your emotions from day to day (excited to be pregnant or concerned something may go wrong with the pregnancy and baby).  You may have more vivid and strange dreams. HOME CARE INSTRUCTIONS   It is very important to avoid all smoking, alcohol and non-prescribed drugs during your pregnancy. These affect the formation and growth of the baby. Avoid chemicals  while pregnant to ensure the delivery of a healthy infant.  Start your prenatal visits by the 12th week of pregnancy. They are usually scheduled monthly at first, then more often in the last 2 months before delivery. Keep your caregiver's appointments. Follow your caregiver's instructions regarding medicine use, blood and lab tests, exercise, and diet.  During pregnancy, you are providing food for you and your baby. Eat regular, well-balanced meals. Choose foods such as meat, fish, milk and other low fat dairy products, vegetables, fruits, and whole-grain breads and cereals. Your caregiver will tell you of the ideal weight gain.  You can help morning sickness by keeping soda crackers at the bedside. Eat a couple before arising in the morning. You may want to use the crackers without salt on them.  Eating 4 to 5 small meals rather than 3 large meals a day also may help the nausea and vomiting.  Drinking liquids between meals instead of during meals also seems to help nausea and vomiting.  A physical sexual relationship may be continued throughout pregnancy if there are no other problems. Problems may be early (premature) leaking of amniotic fluid from the membranes, vaginal bleeding, or belly (abdominal) pain.  Exercise regularly if there are no restrictions. Check with your caregiver or physical therapist if you are unsure of the safety of some of your exercises. Greater weight gain will occur in the last 2 trimesters of pregnancy. Exercising will help:  Control your weight.  Keep you in shape.  Prepare you for labor and delivery.  Help you lose your pregnancy weight after you deliver your baby.  Wear a good support or jogging bra for breast tenderness during pregnancy. This may help if worn during sleep too.  Ask when prenatal classes are available. Begin classes when they are offered.  Do not use hot tubs, steam rooms, or saunas.  Wear your seat belt when driving. This protects you  and your baby if you are in an accident.  Avoid raw meat, uncooked cheese, cat litter boxes, and soil used by cats throughout the pregnancy. These carry germs that can cause birth defects in the baby.  The first trimester is a good time to visit your dentist for your dental health. Getting your teeth cleaned is okay. Use a softer toothbrush and brush gently during pregnancy.  Ask for help if you have financial, counseling, or nutritional needs during pregnancy. Your caregiver will be able to offer counseling for these needs as well as refer you for other special needs.  Do not take any medicines or herbs unless told by your caregiver.  Inform your caregiver if there is any mental or physical domestic violence.  Make a list of emergency phone numbers of family, friends, hospital, and police and fire departments.  Write down your questions. Take them to your prenatal visit.  Do not douche.  Do not cross your legs.  If you have to stand for long periods of time, rotate you feet or take small steps in a circle.  You may have more vaginal secretions that may require a sanitary pad. Do not use tampons or scented sanitary pads. MEDICINES AND DRUG USE IN PREGNANCY  Take prenatal vitamins as directed. The vitamin should contain 1 milligram of folic acid. Keep all vitamins out of reach of children. Only a couple vitamins or tablets containing iron may be fatal to a baby or young child when ingested.  Avoid use of all medicines, including herbs, over-the-counter medicines, not prescribed or suggested by your caregiver. Only take over-the-counter or prescription medicines for pain, discomfort, or fever as directed by your caregiver. Do not use aspirin, ibuprofen, or naproxen unless directed by your caregiver.  Let your caregiver also know about herbs you may be using.  Alcohol is related to a number of birth defects. This includes fetal alcohol syndrome. All alcohol, in any form, should be avoided  completely. Smoking will cause low birth rate and premature babies.  Street or illegal drugs are very harmful to the baby. They are absolutely forbidden. A baby born to an addicted mother will be addicted at birth. The baby will go through the same withdrawal an adult does.  Let your caregiver know about any medicines that you have to take and for what reason you take them. SEEK MEDICAL CARE IF:  You have any concerns or worries during your pregnancy. It is better to call with your questions if you feel they cannot wait, rather than worry about them. SEEK IMMEDIATE MEDICAL CARE IF:   An unexplained oral temperature above 102 F (38.9 C) develops, or as your caregiver suggests.  You have leaking of fluid from the vagina (birth canal). If leaking membranes are suspected, take your temperature and inform your caregiver of this when you call.  There is vaginal spotting or bleeding. Notify your caregiver of the amount and how many pads are used.  You develop a bad smelling vaginal discharge with a change in the color.  You continue to feel sick to your stomach (nauseated) and have no relief from remedies suggested. You vomit blood or coffee ground-like materials.  You lose more than 2 pounds of weight in 1 week.  You gain more than 2 pounds of weight in 1 week and you notice swelling of your face, hands, feet, or legs.  You gain 5 pounds or more in 1 week (even if you do not have swelling of your hands, face, legs, or feet).  You get exposed to [place] measles and have never had them.  You are exposed to fifth disease or chickenpox.  You develop belly (abdominal) pain. Round ligament discomfort is a common non-cancerous (benign) cause of abdominal pain in pregnancy. Your caregiver still must evaluate this.  You develop headache, fever, diarrhea, pain with urination, or shortness of breath.  You fall or are in a car accident or have any kind of trauma.  There is mental or physical  violence in your home. Document Released: 04/08/2001 Document Revised: 01/07/2012 Document Reviewed: 10/10/2008 [facility] Patient Information 2014 [place], [place].

## 2013-07-12 NOTE — MAU Provider Note (Signed)
Chief Complaint: Emesis During Pregnancy and Possible Pregnancy   First Provider Initiated Contact with Patient 07/12/13 2321     SUBJECTIVE HPI: Mary Chang is a 23 y.o. G3P1011 at 3461w6d by LMP who presents with nausea and vomiting of pregnancy. Vomited 3 times in the past 24 hours. No nausea now. Does not have any medication at home. Concerned that she may be dehydrated. Able to keep down food and fluids. Also reports constipation. Last bowel movement today, small. Has been on Zofran the past, but ran out. Wants to know what her due date is.  [facility]. Awaiting pregnancy Medicaid.  Past Medical History  Diagnosis Date  . UTI (lower urinary tract infection)   . Sleep apnea    OB History  Gravida Para Term Preterm AB SAB TAB Ectopic Multiple Living  3 1 1  0 1 0 1 0 0 1    # Outcome Date GA Lbr Len/2nd Weight Sex Delivery Anes PTL Lv  3 CUR           2 TRM 01/28/11 3648w6d 13:38 / 00:02 3.204 kg (7 lb 1 oz) M SVD Local  Y  1 TAB              Past Surgical History  Procedure Laterality Date  . No past surgeries     History   Social History  . Marital Status: Single    Spouse Name: N/A    Number of Children: N/A  . Years of Education: N/A   Occupational History  . Not on file.   Social History Main Topics  . Smoking status: Current Some Day Smoker    Types: Cigarettes  . Smokeless tobacco: Never Used     Comment: Started smoking at age 23.  Currently smoking approx 2 cig/week  . Alcohol Use: No     Comment: states she used MJ on and off through 8th month for nausea  . Drug Use: Yes    Special: Marijuana     Comment: 2 wks ago  . Sexual Activity: Yes    Birth Control/ Protection: None   Other Topics Concern  . Not on file   Social History Narrative  . No narrative on file   No current facility-administered medications on file prior to encounter.   Current Outpatient Prescriptions on File Prior to Encounter   Medication Sig Dispense Refill  . norethindrone-ethinyl estradiol (NECON,BREVICON,MODICON) 0.5-35 MG-MCG tablet Take 1 tablet by mouth daily.      . ondansetron (ZOFRAN) 4 MG tablet Take 1 tablet (4 mg total) by mouth every 6 (six) hours.  12 tablet  0  . [DISCONTINUED] promethazine (PHENERGAN) 25 MG tablet Take 1 tablet (25 mg total) by mouth every 6 (six) hours as needed for nausea or vomiting.  30 tablet  0   No Known Allergies  ROS: Pertinent items in HPI. Negative for fever, chills, diarrhea, abdominal pain, vaginal bleeding or vaginal discharge. Mild constipation. Last bowel movement today.  OBJECTIVE Blood pressure 111/64, pulse 84, temperature 98.3 F (36.8 C), temperature source Oral, resp. rate 18, height 5\' 4"  (1.626 m), weight 85.276 kg (188 lb), last menstrual period 04/28/2013, SpO2 100.00%. GENERAL: Well-developed, well-nourished female in no acute distress.  HEENT: Normocephalic. Mucous membranes moist. HEART: normal rate RESP: normal effort ABDOMEN: Soft, non-tender. Fundus non-palpable. Positive bowel sounds. No CVA tenderness. EXTREMITIES: Nontender, no edema NEURO: Alert and oriented SPECULUM EXAM: Deferred Fetal heart rate 156 by Doppler.  LAB  RESULTS Results for orders placed during the hospital encounter of 07/12/13 (from the past 24 hour(s))  URINALYSIS, ROUTINE W REFLEX MICROSCOPIC     Status: Abnormal   Collection Time    07/12/13 10:00 PM      Result Value Ref Range   Color, Urine YELLOW  YELLOW   APPearance CLEAR  CLEAR   Specific Gravity, Urine <1.005 (*) 1.005 - 1.030   pH 6.0  5.0 - 8.0   Glucose, UA NEGATIVE  NEGATIVE mg/dL   Hgb urine dipstick TRACE (*) NEGATIVE   Bilirubin Urine NEGATIVE  NEGATIVE   Ketones, ur NEGATIVE  NEGATIVE mg/dL   Protein, ur NEGATIVE  NEGATIVE mg/dL   Urobilinogen, UA 0.2  0.0 - 1.0 mg/dL   Nitrite NEGATIVE  NEGATIVE   Leukocytes, UA NEGATIVE  NEGATIVE  URINE MICROSCOPIC-ADD ON     Status: None   Collection Time     07/12/13 10:00 PM      Result Value Ref Range   Squamous Epithelial / LPF RARE  RARE   WBC, UA 0-2  <3 WBC/hpf   RBC / HPF 0-2  <3 RBC/hpf   Bacteria, UA RARE  RARE    IMAGING No results found.  MAU COURSE Declined antiemetics.  ASSESSMENT 1. Nausea and vomiting of pregnancy, antepartum   2. Constipation in pregnancy in first trimester    PLAN Discharge home in stable condition. Small frequent meals and drinks. Increase fluids and fiber. Stool softener when necessary, although test patient likely to improve now that Zofran has been stopped. Recommend vitamin B6 and Unisom until she can get Diclegis with Medicaid. Pregnancy verification letter given. Follow-up Information   Follow up with [facility]. (start prenatal care)    Specialty:  Obstetrics and Gynecology   Contact information:   [address] [place] [address] [phone]      Follow up with [facility]. (As needed in emergencies)    Contact information:   [address] [id number] [place] [address] [phone]        Medication List    STOP taking these medications       norethindrone-ethinyl estradiol 0.5-35 MG-MCG tablet  Commonly known as:  NECON,BREVICON,MODICON     ondansetron 4 MG tablet  Commonly known as:  ZOFRAN      TAKE these medications       PRENATAL VITAMIN PO  Take by mouth.     promethazine 25 MG tablet  Commonly known as:  PHENERGAN  Take 1 tablet (25 mg total) by mouth every 6 (six) hours as needed for nausea or vomiting.       [place], CNM 07/12/2013  10:59 PM

## 2013-07-12 NOTE — MAU Note (Addendum)
Pt reports n/v for for one month. Vomited x 3 in last 24 hours. Pt desires medicine for n/v. +Constipation. Pt reports that she did have a small BM today. Last BM was 3 days prior. Pt reports that she has had headaches. Pt took tylenol on the way to MAU.

## 2013-07-12 NOTE — MAU Note (Signed)
Pt reports vomiting with pregnancy, positive pregnancy test at home. LMP 04/28/2013

## 2013-07-13 NOTE — MAU Provider Note (Signed)
Attestation of Attending Supervision of Advanced Practitioner (PA/CNM/NP): Evaluation and management procedures were performed by the Advanced Practitioner under my supervision and collaboration.  I have reviewed the Advanced Practitioner's note and chart, and I agree with the management and plan.  Reva BoresPRATT,Mischa Brittingham S, MD [facility] Attending 07/13/2013 6:49 AM

## 2013-08-19 LAB — OB RESULTS CONSOLE GBS: STREP GROUP B AG: POSITIVE

## 2013-08-19 LAB — OB RESULTS CONSOLE HEPATITIS B SURFACE ANTIGEN: Hepatitis B Surface Ag: NEGATIVE

## 2013-08-19 LAB — OB RESULTS CONSOLE GC/CHLAMYDIA
Chlamydia: NEGATIVE
Gonorrhea: NEGATIVE

## 2013-08-19 LAB — OB RESULTS CONSOLE RUBELLA ANTIBODY, IGM: Rubella: NON-IMMUNE/NOT IMMUNE

## 2013-08-19 LAB — OB RESULTS CONSOLE ABO/RH: RH TYPE: POSITIVE

## 2013-08-19 LAB — OB RESULTS CONSOLE HIV ANTIBODY (ROUTINE TESTING): HIV: NONREACTIVE

## 2013-08-19 LAB — OB RESULTS CONSOLE ANTIBODY SCREEN: ANTIBODY SCREEN: NEGATIVE

## 2013-11-16 LAB — OB RESULTS CONSOLE RPR: RPR: NONREACTIVE

## 2014-01-09 ENCOUNTER — Inpatient Hospital Stay (HOSPITAL_COMMUNITY)
Admission: AD | Admit: 2014-01-09 | Discharge: 2014-01-11 | DRG: 775 | Disposition: A | Discharge status: Home / Self Care | Payer: Medicaid Other | Source: Ambulatory Visit | Attending: Obstetrics & Gynecology | Admitting: Obstetrics & Gynecology

## 2014-01-09 ENCOUNTER — Encounter (HOSPITAL_COMMUNITY): Payer: Self-pay | Admitting: Obstetrics and Gynecology

## 2014-01-09 DIAGNOSIS — O99344 Other mental disorders complicating childbirth: Secondary | ICD-10-CM | POA: Diagnosis present

## 2014-01-09 DIAGNOSIS — Z22330 Carrier of Group B streptococcus: Secondary | ICD-10-CM | POA: Diagnosis not present

## 2014-01-09 DIAGNOSIS — R8271 Bacteriuria: Secondary | ICD-10-CM | POA: Diagnosis present

## 2014-01-09 DIAGNOSIS — O4290 Premature rupture of membranes, unspecified as to length of time between rupture and onset of labor, unspecified weeks of gestation: Secondary | ICD-10-CM | POA: Diagnosis present

## 2014-01-09 DIAGNOSIS — G4730 Sleep apnea, unspecified: Secondary | ICD-10-CM | POA: Diagnosis present

## 2014-01-09 DIAGNOSIS — Z6841 Body Mass Index (BMI) 40.0 and over, adult: Secondary | ICD-10-CM | POA: Diagnosis not present

## 2014-01-09 DIAGNOSIS — Z87891 Personal history of nicotine dependence: Secondary | ICD-10-CM | POA: Diagnosis not present

## 2014-01-09 DIAGNOSIS — Z8249 Family history of ischemic heart disease and other diseases of the circulatory system: Secondary | ICD-10-CM

## 2014-01-09 DIAGNOSIS — F1210 Cannabis abuse, uncomplicated: Secondary | ICD-10-CM | POA: Diagnosis present

## 2014-01-09 DIAGNOSIS — Z833 Family history of diabetes mellitus: Secondary | ICD-10-CM | POA: Diagnosis not present

## 2014-01-09 DIAGNOSIS — O99214 Obesity complicating childbirth: Secondary | ICD-10-CM

## 2014-01-09 DIAGNOSIS — E669 Obesity, unspecified: Secondary | ICD-10-CM | POA: Diagnosis present

## 2014-01-09 DIAGNOSIS — Z8709 Personal history of other diseases of the respiratory system: Secondary | ICD-10-CM

## 2014-01-09 DIAGNOSIS — O9989 Other specified diseases and conditions complicating pregnancy, childbirth and the puerperium: Secondary | ICD-10-CM

## 2014-01-09 DIAGNOSIS — O99892 Other specified diseases and conditions complicating childbirth: Secondary | ICD-10-CM | POA: Diagnosis present

## 2014-01-09 DIAGNOSIS — O4700 False labor before 37 completed weeks of gestation, unspecified trimester: Secondary | ICD-10-CM | POA: Diagnosis present

## 2014-01-09 LAB — CBC
HCT: 33.3 % — ABNORMAL LOW (ref 36.0–46.0)
Hemoglobin: 11.2 g/dL — ABNORMAL LOW (ref 12.0–15.0)
MCH: 29.6 pg (ref 26.0–34.0)
MCHC: 33.6 g/dL (ref 30.0–36.0)
MCV: 88.1 fL (ref 78.0–100.0)
Platelets: 204 10*3/uL (ref 150–400)
RBC: 3.78 MIL/uL — ABNORMAL LOW (ref 3.87–5.11)
RDW: 13.5 % (ref 11.5–15.5)
WBC: 8.7 10*3/uL (ref 4.0–10.5)

## 2014-01-09 LAB — TYPE AND SCREEN
ABO/RH(D): O POS
ANTIBODY SCREEN: NEGATIVE

## 2014-01-09 LAB — POCT FERN TEST: POCT Fern Test: POSITIVE

## 2014-01-09 MED ORDER — CITRIC ACID-SODIUM CITRATE 334-500 MG/5ML PO SOLN: 30.0000 mL | ORAL | Status: DC | PRN

## 2014-01-09 MED ORDER — FLEET ENEMA 7-19 GM/118ML RE ENEM: 1.0000 | ENEMA | RECTAL | Status: DC | PRN

## 2014-01-09 MED ORDER — OXYCODONE-ACETAMINOPHEN 5-325 MG PO TABS: 1.0000 | ORAL_TABLET | ORAL | Status: DC | PRN

## 2014-01-09 MED ORDER — LIDOCAINE HCL (PF) 1 % IJ SOLN
30.0000 mL | INTRAMUSCULAR | Status: DC | PRN
  Administered 2014-01-09: 30 mL via SUBCUTANEOUS
  Filled 2014-01-09: qty 30

## 2014-01-09 MED ORDER — MISOPROSTOL 200 MCG PO TABS
1000.0000 ug | ORAL_TABLET | Freq: Once | ORAL | Status: AC
  Administered 2014-01-09: 1000 ug via RECTAL

## 2014-01-09 MED ORDER — OXYTOCIN 40 UNITS IN LACTATED RINGERS INFUSION - SIMPLE MED
62.5000 mL/h | INTRAVENOUS | Status: DC
  Filled 2014-01-09: qty 1000

## 2014-01-09 MED ORDER — LACTATED RINGERS IV SOLN: 500.0000 mL | INTRAVENOUS | Status: DC | PRN

## 2014-01-09 MED ORDER — TERBUTALINE SULFATE 1 MG/ML IJ SOLN: 0.2500 mg | Freq: Once | INTRAMUSCULAR | Status: AC | PRN

## 2014-01-09 MED ORDER — ACETAMINOPHEN 325 MG PO TABS: 650.0000 mg | ORAL_TABLET | ORAL | Status: DC | PRN

## 2014-01-09 MED ORDER — OXYCODONE-ACETAMINOPHEN 5-325 MG PO TABS: 2.0000 | ORAL_TABLET | ORAL | Status: DC | PRN

## 2014-01-09 MED ORDER — MISOPROSTOL 200 MCG PO TABS: 1000.0000 ug | ORAL_TABLET | Freq: Once | ORAL | Status: DC

## 2014-01-09 MED ORDER — PENICILLIN G POTASSIUM 5000000 UNITS IJ SOLR
5.0000 10*6.[IU] | Freq: Once | INTRAVENOUS | Status: AC
  Administered 2014-01-09: 5 10*6.[IU] via INTRAVENOUS
  Filled 2014-01-09: qty 5

## 2014-01-09 MED ORDER — OXYTOCIN 10 UNIT/ML IJ SOLN
INTRAMUSCULAR | Status: AC
  Administered 2014-01-09: 10 [IU] via INTRAMUSCULAR
  Filled 2014-01-09: qty 1

## 2014-01-09 MED ORDER — OXYTOCIN BOLUS FROM INFUSION
500.0000 mL | INTRAVENOUS | Status: DC
  Administered 2014-01-10: 500 mL via INTRAVENOUS

## 2014-01-09 MED ORDER — METHYLERGONOVINE MALEATE 0.2 MG/ML IJ SOLN
INTRAMUSCULAR | Status: AC
  Filled 2014-01-09: qty 1

## 2014-01-09 MED ORDER — PENICILLIN G POTASSIUM 5000000 UNITS IJ SOLR
2.5000 10*6.[IU] | INTRAVENOUS | Status: DC
  Filled 2014-01-09 (×2): qty 2.5

## 2014-01-09 MED ORDER — OXYTOCIN 10 UNIT/ML IJ SOLN
10.0000 [IU] | Freq: Once | INTRAMUSCULAR | Status: AC
  Administered 2014-01-09: 10 [IU] via INTRAMUSCULAR

## 2014-01-09 MED ORDER — ONDANSETRON HCL 4 MG/2ML IJ SOLN: 4.0000 mg | Freq: Four times a day (QID) | INTRAMUSCULAR | Status: DC | PRN

## 2014-01-09 MED ORDER — MISOPROSTOL 200 MCG PO TABS
ORAL_TABLET | ORAL | Status: AC
  Filled 2014-01-09: qty 5

## 2014-01-09 MED ORDER — ZOLPIDEM TARTRATE 5 MG PO TABS: 5.0000 mg | ORAL_TABLET | Freq: Every evening | ORAL | Status: DC | PRN

## 2014-01-09 MED ORDER — LACTATED RINGERS IV SOLN
INTRAVENOUS | Status: DC
  Administered 2014-01-09: 18:00:00 via INTRAVENOUS
  Administered 2014-01-09: 125 mL/h via INTRAVENOUS

## 2014-01-09 MED ORDER — BUTORPHANOL TARTRATE 1 MG/ML IJ SOLN
1.0000 mg | INTRAMUSCULAR | Status: DC | PRN
  Administered 2014-01-09: 1 mg via INTRAVENOUS
  Filled 2014-01-09: qty 1

## 2014-01-09 MED ORDER — OXYTOCIN 40 UNITS IN LACTATED RINGERS INFUSION - SIMPLE MED
1.0000 m[IU]/min | INTRAVENOUS | Status: DC
  Administered 2014-01-09: 2 m[IU]/min via INTRAVENOUS
  Filled 2014-01-09: qty 1000

## 2014-01-09 NOTE — H&P (Signed)
Mary Chang is a 23 y.o. female, G3P1011 at 52 1/7 weeks, presenting for leaking since 10:30am, occasional UCs, +FM.   Patient Active Problem List   Diagnosis Date Noted  . GBS bacteriuria 01/09/2014  . Severe obesity (BMI >= 40) 01/09/2014  . Hx of pulmonary edema--pp 01/09/2014  . Sleep apnea 02/03/2011  Has been followed at [facility] for sleep apnea--uses CPAP machine very sporadically.  History of present pregnancy: Patient entered care at 16 2/7 weeks.   EDC of 02/05/14 was established by 16 weeks [place] due to ? LMP.   Anatomy scan:  16 2/7 and 20 1/7 weeks, with normal findings and a posterior placenta.   Additional [place] evaluations:   34 3/7 weeks, due to S>D:  EFW 4+9, 66%ile, AFI 60%ile, vtx   Significant prenatal events:  Entered care at 16 weeks, with US done for dating due to ? LMP.  Dating changed by one month, with EDC established 02/05/14.  No complications during pregnancy.  Had episode of emotional issue with FOB at 32 weeks, but was unwilling to share issues.     Last evaluation:  12/28/13, total weight gain 48 lbs.    OB History   Grav Para Term Preterm Abortions TAB SAB Ect Mult Living   0 1 1 0 0 0 1    2012--SVB, 38 weeks, 13 hour labor, no meds, 7+1, female.  Had sporadic elevation of BP during labor, requiring single dose of Labetalol. Re-admitted 6 days after delivery for pulmonary edema, ? related to sleep apnea.  Given Lasix with resolution.  Referred to [facility] and Rx'd with K+ replacement.  Has used CPAP machine at times, but not using routinely. ? Year--TAB  Past Medical History  Diagnosis Date  . UTI (lower urinary tract infection)   . Sleep apnea    Past Surgical History  Procedure Laterality Date  . No past surgeries     Family History: family history includes Diabetes in her father and paternal grandfather; Heart disease in her maternal grandmother; Hypertension in her father, maternal grandmother, and paternal  grandfather.  Social History:  reports that she quit smoking about 7 months ago. Her smoking use included Cigarettes. She smoked 0.00 packs per day. She has never used smokeless tobacco. She reports that she uses illicit drugs (Marijuana). She reports that she does not drink alcohol.  She is [place] [employer], of the [employer], single, is a Consulting civil engineer.  FOB is present with her today Isac Sarna).   Prenatal Transfer Tool  Maternal Diabetes: No Genetic Screening: Declined Maternal Ultrasounds/Referrals: Normal Fetal Ultrasounds or other Referrals:  None Maternal Substance Abuse:  No Significant Maternal Medications:  None Significant Maternal Lab Results: Lab values include: Group B Strep positive    ROS:  Leaking clear fluid  No Known Allergies   Dilation: 2.5 Effacement (%): 80 Exam by:: VEmilee Hero CNM Blood pressure 123/69, pulse 117, temperature 98.1 F (36.7 C), temperature source Oral, resp. rate 17, height  (1.626 m), weight 238 lb (107.956 kg), last menstrual period 04/21/2013.  Chest clear Heart RRR without murmur Abd gravid, NT, FH 37 cm Pelvic: 2-3 cm, 80%, vtx, -1, leaking clear fluid Ext: DTR 1+, no clonus, trace edema  FHR: Category 2 at present, non-reactive, but reassuring. UCs:  Irregular, mild  Prenatal labs: ABO, Rh:  O+ Antibody:  Neg Rubella:   Non-immune RPR:   NR HBsAg:   Neg HIV:   NR GBS:  Positive 08/23/13 Sickle  cell/Hgb electrophoresis:  AA Pap:  WNL 08/23/13 GC: Negative 08/19/13 Chlamydia: Negative 08/19/13 Genetic screenings:  Declined Glucola:  WNL Other:  Hgb 12.7 at NOB, 12.6 at 28 weeks. Negative urine culture 12/28/13    Assessment/Plan: IUP at 36 1/7 weeks PROM x 7 hours GBS positive Rubella non-immune Hx pulmonary edema pp Sleep apnea Elevated BMI  Plan: Admit to Birthing Suite per consult with Dr. Estanislado Pandy Routine [facility] orders GBS Rx with PCN G per standard dosing. Recommend augmentation with pitocin--patient  agreeable. Patient does not plan epidural. Per consult with Dr. Estanislado Pandy, plan close observation pp in light of previous hx of pulmonary edema in the early pp period.  May need cardiac consult if sx recurr.  Nyra Capes, [place] 01/09/2014, 5:51 PM

## 2014-01-09 NOTE — Progress Notes (Signed)
  Subjective: Meet and greet. No c/o.  Objective: BP 126/73  Pulse 112  Temp(Src) 98.2 F (36.8 C) (Oral)  Resp 18  Ht  (1.626 m)  Wt 238 lb (107.956 kg)  BMI 40.83 kg/m2  LMP 04/21/2013      FHT: BL 145 w/ mod variability, +accels, no decels UC:   irregular SVE:   Dilation: 3 Effacement (%): 80 Exam by:: Jalia Zuniga willimas cnm    Assessment:  Latent labor SROM Cat 1 FHRT GBS positive  Plan: Begin Pitocin augmentation Continue current plan Consult prn Anticipate progress and SVD  Sherre Scarlet CNM 01/09/2014, 7:37 PM

## 2014-01-10 ENCOUNTER — Encounter (HOSPITAL_COMMUNITY): Payer: Self-pay | Admitting: *Deleted

## 2014-01-10 LAB — CBC
HEMATOCRIT: 27.9 % — AB (ref 36.0–46.0)
HEMOGLOBIN: 9.6 g/dL — AB (ref 12.0–15.0)
MCH: 30 pg (ref 26.0–34.0)
MCHC: 34.4 g/dL (ref 30.0–36.0)
MCV: 87.2 fL (ref 78.0–100.0)
Platelets: 190 10*3/uL (ref 150–400)
RBC: 3.2 MIL/uL — AB (ref 3.87–5.11)
RDW: 13.3 % (ref 11.5–15.5)
WBC: 13.5 10*3/uL — AB (ref 4.0–10.5)

## 2014-01-10 LAB — RPR

## 2014-01-10 MED ORDER — ONDANSETRON HCL 4 MG/2ML IJ SOLN: 4.0000 mg | INTRAMUSCULAR | Status: DC | PRN

## 2014-01-10 MED ORDER — ONDANSETRON HCL 4 MG PO TABS: 4.0000 mg | ORAL_TABLET | ORAL | Status: DC | PRN

## 2014-01-10 MED ORDER — LANOLIN HYDROUS EX OINT: TOPICAL_OINTMENT | CUTANEOUS | Status: DC | PRN

## 2014-01-10 MED ORDER — DIPHENHYDRAMINE HCL 25 MG PO CAPS: 25.0000 mg | ORAL_CAPSULE | Freq: Four times a day (QID) | ORAL | Status: DC | PRN

## 2014-01-10 MED ORDER — SENNOSIDES-DOCUSATE SODIUM 8.6-50 MG PO TABS
2.0000 | ORAL_TABLET | ORAL | Status: DC
  Administered 2014-01-10: 2 via ORAL
  Filled 2014-01-10: qty 2

## 2014-01-10 MED ORDER — DIBUCAINE 1 % RE OINT: 1.0000 "application " | TOPICAL_OINTMENT | RECTAL | Status: DC | PRN

## 2014-01-10 MED ORDER — OXYCODONE-ACETAMINOPHEN 5-325 MG PO TABS
1.0000 | ORAL_TABLET | ORAL | Status: DC | PRN
  Administered 2014-01-10 – 2014-01-11 (×5): 1 via ORAL
  Filled 2014-01-10 (×6): qty 1

## 2014-01-10 MED ORDER — PRENATAL MULTIVITAMIN CH
1.0000 | ORAL_TABLET | Freq: Every day | ORAL | Status: DC
  Administered 2014-01-10 – 2014-01-11 (×2): 1 via ORAL
  Filled 2014-01-10 (×2): qty 1

## 2014-01-10 MED ORDER — FERROUS SULFATE 325 (65 FE) MG PO TABS
325.0000 mg | ORAL_TABLET | Freq: Two times a day (BID) | ORAL | Status: DC
Start: 2014-01-10 — End: 2014-01-11
  Administered 2014-01-10 – 2014-01-11 (×3): 325 mg via ORAL
  Filled 2014-01-10 (×3): qty 1

## 2014-01-10 MED ORDER — ZOLPIDEM TARTRATE 5 MG PO TABS: 5.0000 mg | ORAL_TABLET | Freq: Every evening | ORAL | Status: DC | PRN

## 2014-01-10 MED ORDER — SIMETHICONE 80 MG PO CHEW: 80.0000 mg | CHEWABLE_TABLET | ORAL | Status: DC | PRN

## 2014-01-10 MED ORDER — BENZOCAINE-MENTHOL 20-0.5 % EX AERO: 1.0000 "application " | INHALATION_SPRAY | CUTANEOUS | Status: DC | PRN

## 2014-01-10 MED ORDER — MEASLES, MUMPS & RUBELLA VAC ~~LOC~~ INJ
0.5000 mL | INJECTION | Freq: Once | SUBCUTANEOUS | Status: DC
  Filled 2014-01-10: qty 0.5

## 2014-01-10 MED ORDER — TETANUS-DIPHTH-ACELL PERTUSSIS 5-2.5-18.5 LF-MCG/0.5 IM SUSP: 0.5000 mL | Freq: Once | INTRAMUSCULAR | Status: DC

## 2014-01-10 MED ORDER — INFLUENZA VAC SPLIT QUAD 0.5 ML IM SUSY: 0.5000 mL | PREFILLED_SYRINGE | INTRAMUSCULAR | Status: DC

## 2014-01-10 MED ORDER — OXYCODONE-ACETAMINOPHEN 5-325 MG PO TABS: 2.0000 | ORAL_TABLET | ORAL | Status: DC | PRN

## 2014-01-10 MED ORDER — WITCH HAZEL-GLYCERIN EX PADS
1.0000 "application " | MEDICATED_PAD | CUTANEOUS | Status: DC | PRN
  Administered 2014-01-10: 1 via TOPICAL

## 2014-01-10 MED ORDER — IBUPROFEN 600 MG PO TABS
600.0000 mg | ORAL_TABLET | Freq: Four times a day (QID) | ORAL | Status: DC
  Administered 2014-01-10 – 2014-01-11 (×8): 600 mg via ORAL
  Filled 2014-01-10 (×7): qty 1

## 2014-01-10 NOTE — Discharge Summary (Signed)
  Vaginal Delivery Discharge Summary  Mary Chang  DOB:    1991-01-15 MRN:    [id number] CSN:    [id number]  Date of admission:                  01/09/14  Date of discharge:                   01/11/14  Procedures this admission:   SVB, pitocin augmentation, repair of 1st degree laceration  Date of Delivery: 01/09/14  Newborn Data:  Live born female  Birth Weight: 6 lb 4.5 oz (2849 g) APGAR: 9, 9  Home with mother. Name: Mary Chang   History of Present Illness:  Mary Chang is a 23 y.o. female, [id number], who presents at [redacted]w[redacted]d weeks gestation. The patient has been followed at the [facility] division of [facility]. She was admitted rupture of membranes. Her pregnancy has been complicated by:  Patient Active Problem List   Diagnosis Date Noted  . Excessive postpartum bleeding 01/10/2014  . Severe obesity (BMI >= 40) 01/09/2014  . Hx of pulmonary edema--pp 01/09/2014  . NSVD (normal spontaneous vaginal delivery) 01/09/2014  . Sleep apnea 02/03/2011     Hospital Course:  Admitted 01/09/14 with SROM x 7-8 hours, minimal labor. Positive GBS. Progressed with pitocin augmentation. Utilized single dose IV med for pain management.  Delivery was performed by Sherre Scarlet, CNM, without complication. Patient did have episode of heavy bleeding after delivery, treated with fundal massage and Cytotech.  Patient and baby tolerated the procedure without difficulty, with small 1st degree vaginal laceration noted. Infant status was stable and remained in room with mother.  Mother and infant then had an uncomplicated postpartum course, with breast feeding going well. Mom's physical exam was WNL, and she was discharged home in stable condition. Contraception plan was Micronor.  She received adequate benefit from po pain medications.  Patient was counseled regarding need to notify us with any pp SOB, chest pain, increased swelling, or any other  issue due to hx of pp pulmonary edema following last delivery.   Feeding:  breast  Contraception:  oral progesterone-only contraceptive  Discharge hemoglobin:  Hemoglobin  Date Value Ref Range Status  01/10/2014 9.6* 12.0 - 15.0 g/dL Final     HCT  Date Value Ref Range Status  01/10/2014 27.9* 36.0 - 46.0 % Final    Discharge Physical Exam:   General: alert Lochia: appropriate Uterine Fundus: firm Incision: healing well DVT Evaluation: No evidence of DVT seen on physical exam. Negative Homan's sign.  Intrapartum Procedures: spontaneous vaginal delivery Postpartum Procedures: none Complications-Operative and Postpartum: 1st degree perineal laceration  Discharge Diagnoses: Term Pregnancy-delivered  Discharge Information:  Activity:           pelvic rest Diet:                routine Medications: Ibuprofen, Iron, Percocet and Micronor Condition:      stable Instructions:     Discharge to: home     Nigel Bridgeman [facility] 01/10/2014 3:32 PM

## 2014-01-10 NOTE — Lactation Note (Signed)
This note was copied from the chart of Mary Chang. Lactation Consultation Note  Patient Name: Mary Chang [id number] Date: 01/10/2014 Reason for consult: Follow-up assessment;Late preterm infant weighing just over 6 pounds but born at 28 weeks 1 day.  Mom is experienced with nursing and pumping for her first baby for 2 months and states she prefers using hand pump.  She is on [facility] and LC provided hand pump and review of use, as well as storage guidelines for ebm on pg 25 of Baby and Me.  LC also discussed plan with RN, Rip Harbour.  DEBP kit given if mom decides to request a [facility] loaner and WIC pump for use after discharge.  LPI guidelines (parent handout) given with review of baby's special needs for a minimum of q3h feedings, STS and supplement per guidelines on handout, if indicated.  LC also encouraged mom to pump 10-15 minutes per breast q3h if supplement needed and for additional stimulation of her milk supply.   Maternal Data Formula Feeding for Exclusion: No  Feeding    LATCH Score/Interventions      Initial LATCH score=8 after delivery but baby sleepy since then and just fed 5 ml's of formula by bottle, at mom's request (mom to call for RN or [place] assistance at next feeding)                Lactation Tools Discussed/Used [facility] Program: Yes Pump Review: Setup, frequency, and cleaning;Milk Storage (mom familiar with Medela hand and electric pumps) Initiated by:: Mechele Claude.Marshell Levan, RN, IBCLC Date initiated:: 01/10/14 Hand pump and kit for DEBP (mom prefers hand pump) Hand expression LPI handout and feeding guidelines  Consult Status Consult Status: Follow-up Date: 01/11/14 Follow-up type: In-patient    Junious Dresser [facility] 01/10/2014, 5:29 PM

## 2014-01-10 NOTE — Progress Notes (Signed)
  Subjective: C/O pain 8/10; requests IV pain medication. Declines epidural.  Objective: BP 131/82  Pulse 89  Temp(Src) 97.9 F (36.6 C) (Oral)  Resp 18  Ht  (1.626 m)  Wt 238 lb (107.956 kg)  BMI 40.83 kg/m2  LMP 04/21/2013   Total I/O In: -  Out: 400 [Blood:400]  FHT: BL 145 w/ mod variability, +accels, +earlys, occ variables, no lates UC:   Irregular SVE:   Dilation: 4.5 Effacement (%): 80 Exam by:: harker RN Pitocin at 4 mius/min  Assessment:  Progressive labor; entering active phase GBS positive NICHD, Cat 2  Plan: Maternal resuscitative measures Continue GBS prophylaxis IV pain medication Consult prn Expect progress and SVD  Sherre Scarlet CNM 01/09/14 @ 9:30 PM

## 2014-01-10 NOTE — Progress Notes (Signed)
UR chart review completed.  

## 2014-01-10 NOTE — Progress Notes (Signed)
Care transferred to Woody Seller, RN

## 2014-01-10 NOTE — Progress Notes (Signed)
Subjective: Postpartum Day 1: Vaginal delivery, 1st degree vaginal laceration Patient up ad lib, reports no syncope or dizziness. Feeding:  Breast Contraceptive plan:  Considering Micronor  Had increased bleeding immediately pp--resolved with voiding and Cytotech.  Objective: Vital signs in last 24 hours: Temp:  [97.9 F (36.6 C)-98.7 F (37.1 C)] 98.7 F (37.1 C) (09/15 0630) Pulse Rate:  [89-117] 103 (09/15 0630) Resp:  [16-20] 18 (09/15 0630) BP: (103-141)/(69-92) 132/80 mmHg (09/15 0630) Weight:  [238 lb (107.956 kg)] 238 lb (107.956 kg) (09/14 1817)  Physical Exam:  General: alert Lochia: appropriate Uterine Fundus: firm Perineum: healing well DVT Evaluation: No evidence of DVT seen on physical exam. Negative Homan's sign.    Recent Labs  01/09/14 1745 01/10/14 0540  HGB 11.2* 9.6*  HCT 33.3* 27.9*  Orthostatics stable.  Assessment/Plan: Status post vaginal delivery day 1. Stable Continue current care. Plan for discharge tomorrow    Nyra Capes 01/10/2014, 10:56 AM

## 2014-01-10 NOTE — Lactation Note (Addendum)
This note was copied from the chart of Girl Yentl Goodall. Lactation Consultation Note: Initial visit with mom. She is heading to bathroom when I went into room. Mom reports that baby nursed well after delivery but has been sleepy since. Has a few 5 min feedings. Encouraged skin to skin and to wathc for feeding cues. Encouraged to call for assist prn. Reviewed normal behavior the first 24 hours and cluster feeding through the night. No questions at present. BF brochure given with resources for support after DC.  Patient Name: Girl Patty Leitzke [id number] Date: 01/10/2014 Reason for consult: Initial assessment   Maternal Data Formula Feeding for Exclusion: No  Feeding Feeding Type: Breast Fed  LATCH Score/Interventions                      Lactation Tools Discussed/Used     Consult Status Consult Status: Follow-up Date: 01/11/14 Follow-up type: In-patient    Pamelia Hoit 01/10/2014, 1:37 PM

## 2014-01-10 NOTE — Plan of Care (Signed)
Problem: Discharge Progression Outcomes Goal: MMR given as ordered Outcome: Not Met (add Reason) Needs MMR- rubella non immune     

## 2014-01-11 MED ORDER — OXYCODONE-ACETAMINOPHEN 5-325 MG PO TABS: 1.0000 | ORAL_TABLET | ORAL | Status: DC | PRN

## 2014-01-11 MED ORDER — NORETHINDRONE 0.35 MG PO TABS: 1.0000 | ORAL_TABLET | Freq: Every day | ORAL | Status: DC

## 2014-01-11 MED ORDER — IBUPROFEN 600 MG PO TABS: 600.0000 mg | ORAL_TABLET | Freq: Four times a day (QID) | ORAL | Status: DC | PRN

## 2014-01-11 MED ORDER — FERROUS SULFATE 325 (65 FE) MG PO TABS: 325.0000 mg | ORAL_TABLET | Freq: Every day | ORAL | Status: AC

## 2014-01-11 NOTE — Lactation Note (Signed)
This note was copied from the chart of Mary Chang. Lactation Consultation Note  Patient Name: Mary Chang [id number] Date: 01/11/2014 Reason for consult: Follow-up assessment;Late preterm infant  Mom has breastfed attempts x4 in past 24 hrs with bottle x7 (5-15 ml).  Voids-4; stools-3 in past 24 hrs.  Infant's bili levels are elevated to 9 at 32 hrs old and MD has ordered bili levels to be rechecked at 4p today before discharge.  Mom has WIC and has a HP for breast stimulation.  Mom stated she is "leaning toward formula feeding since the baby does not want to breastfeed."  Lots education given regarding LPI and risks of bottle feeding in relation to breastfeeding success.  Mom then stated she would like to breastfeed and did not know about the educational information LC gave.  LC worked with positioning of baby and latching baby in asymmetrical latching technique; baby too sleepy to latch with a consistent suck had just fed 10 ml formula on hour prior to [facility] visit.  Lots encouragement given and education about latching.  Mom appreciative of assistance.  Encouraged mom to latch infant prior to offering formula and using some formula if needed at end of feeding session.  Also encouraged mom to use hand pump after feeding several times during day to encouraged milk production.  Discussed supply and demand and encouraged breastfeeding with feeding cues.  Encouraged mom to follow up with [facility] and also encouraged her to call Lactation for questions after discharge.     Maternal Data Has patient been taught Hand Expression?: Yes (reviewed with patient - small drop noted.) Does the patient have breastfeeding experience prior to this delivery?: Yes  Feeding Feeding Type: Formula  LATCH Score/Interventions Latch: Repeated attempts needed to sustain latch, nipple held in mouth throughout feeding, stimulation needed to elicit sucking reflex. Intervention(s): Assist with latch;Breast  compression  Audible Swallowing: None (too sleepy) Intervention(s): Hand expression Intervention(s): Skin to skin  Type of Nipple: Everted at rest and after stimulation        Hold (Positioning): Assistance needed to correctly position infant at breast and maintain latch. Intervention(s): Breastfeeding basics reviewed;Support Pillows;Skin to skin (mom had fed 10 ml formula an hour prior to visit)     Lactation Tools Discussed/Used WIC Program: Yes   Consult Status Consult Status: Complete    Lendon Ka 01/11/2014, 1:40 PM

## 2014-01-11 NOTE — Discharge Instructions (Signed)

## 2014-01-11 NOTE — Progress Notes (Signed)
Patient declined rubella.. vis sheet given explanation of needing rubella explained . Pt still refused, want to get in OB office

## 2014-02-27 ENCOUNTER — Encounter (HOSPITAL_COMMUNITY): Payer: Self-pay | Admitting: *Deleted

## 2016-12-31 ENCOUNTER — Encounter (HOSPITAL_COMMUNITY): Payer: Self-pay | Admitting: Emergency Medicine

## 2016-12-31 DIAGNOSIS — O26891 Other specified pregnancy related conditions, first trimester: Secondary | ICD-10-CM | POA: Insufficient documentation

## 2016-12-31 DIAGNOSIS — Z87891 Personal history of nicotine dependence: Secondary | ICD-10-CM | POA: Diagnosis not present

## 2016-12-31 DIAGNOSIS — Z3201 Encounter for pregnancy test, result positive: Secondary | ICD-10-CM | POA: Diagnosis not present

## 2016-12-31 DIAGNOSIS — Z79899 Other long term (current) drug therapy: Secondary | ICD-10-CM | POA: Insufficient documentation

## 2016-12-31 DIAGNOSIS — Z791 Long term (current) use of non-steroidal anti-inflammatories (NSAID): Secondary | ICD-10-CM | POA: Diagnosis not present

## 2016-12-31 DIAGNOSIS — O9989 Other specified diseases and conditions complicating pregnancy, childbirth and the puerperium: Secondary | ICD-10-CM | POA: Diagnosis not present

## 2016-12-31 DIAGNOSIS — B373 Candidiasis of vulva and vagina: Secondary | ICD-10-CM | POA: Diagnosis not present

## 2016-12-31 DIAGNOSIS — R112 Nausea with vomiting, unspecified: Secondary | ICD-10-CM | POA: Diagnosis present

## 2016-12-31 NOTE — ED Triage Notes (Signed)
Pt reports gen abd pain, N/V X1 week, also reports HA. Dull in nature, 7/10.

## 2017-01-01 ENCOUNTER — Inpatient Hospital Stay (EMERGENCY_DEPARTMENT_HOSPITAL)
Admission: AD | Admit: 2017-01-01 | Discharge: 2017-01-01 | Disposition: A | Discharge status: Home / Self Care | Payer: Medicaid Other | Source: Ambulatory Visit | Attending: Obstetrics and Gynecology | Admitting: Obstetrics and Gynecology

## 2017-01-01 ENCOUNTER — Encounter (HOSPITAL_COMMUNITY): Payer: Self-pay

## 2017-01-01 ENCOUNTER — Emergency Department (HOSPITAL_COMMUNITY)
Admission: EM | Admit: 2017-01-01 | Discharge: 2017-01-01 | Discharge status: Against Medical Advice | Payer: Medicaid Other | Attending: Emergency Medicine | Admitting: Emergency Medicine

## 2017-01-01 DIAGNOSIS — R1111 Vomiting without nausea: Secondary | ICD-10-CM

## 2017-01-01 DIAGNOSIS — O9989 Other specified diseases and conditions complicating pregnancy, childbirth and the puerperium: Secondary | ICD-10-CM | POA: Diagnosis not present

## 2017-01-01 DIAGNOSIS — Z3490 Encounter for supervision of normal pregnancy, unspecified, unspecified trimester: Secondary | ICD-10-CM

## 2017-01-01 DIAGNOSIS — O26891 Other specified pregnancy related conditions, first trimester: Secondary | ICD-10-CM

## 2017-01-01 DIAGNOSIS — N898 Other specified noninflammatory disorders of vagina: Secondary | ICD-10-CM

## 2017-01-01 DIAGNOSIS — B3731 Acute candidiasis of vulva and vagina: Secondary | ICD-10-CM

## 2017-01-01 DIAGNOSIS — B373 Candidiasis of vulva and vagina: Secondary | ICD-10-CM | POA: Diagnosis not present

## 2017-01-01 LAB — WET PREP, GENITAL
CLUE CELLS WET PREP: NONE SEEN
Sperm: NONE SEEN
Trich, Wet Prep: NONE SEEN
YEAST WET PREP: NONE SEEN

## 2017-01-01 LAB — URINALYSIS, ROUTINE W REFLEX MICROSCOPIC
Bilirubin Urine: NEGATIVE
Glucose, UA: NEGATIVE mg/dL
Hgb urine dipstick: NEGATIVE
Ketones, ur: NEGATIVE mg/dL
LEUKOCYTES UA: NEGATIVE
Nitrite: NEGATIVE
PH: 6 (ref 5.0–8.0)
PROTEIN: NEGATIVE mg/dL
SPECIFIC GRAVITY, URINE: 1.013 (ref 1.005–1.030)

## 2017-01-01 LAB — CBC
HCT: 36.6 % (ref 36.0–46.0)
Hemoglobin: 12.4 g/dL (ref 12.0–15.0)
MCH: 29.3 pg (ref 26.0–34.0)
MCHC: 33.9 g/dL (ref 30.0–36.0)
MCV: 86.5 fL (ref 78.0–100.0)
PLATELETS: 199 10*3/uL (ref 150–400)
RBC: 4.23 MIL/uL (ref 3.87–5.11)
RDW: 13.6 % (ref 11.5–15.5)
WBC: 7.4 10*3/uL (ref 4.0–10.5)

## 2017-01-01 LAB — COMPREHENSIVE METABOLIC PANEL
ALT: 10 U/L — ABNORMAL LOW (ref 14–54)
AST: 25 U/L (ref 15–41)
Albumin: 3.8 g/dL (ref 3.5–5.0)
Alkaline Phosphatase: 47 U/L (ref 38–126)
Anion gap: 9 (ref 5–15)
BUN: 6 mg/dL (ref 6–20)
CO2: 23 mmol/L (ref 22–32)
Calcium: 8.8 mg/dL — ABNORMAL LOW (ref 8.9–10.3)
Chloride: 106 mmol/L (ref 101–111)
Creatinine, Ser: 0.83 mg/dL (ref 0.44–1.00)
GFR calc Af Amer: >60 mL/min (ref >60)
GFR calc non Af Amer: >60 mL/min (ref >60)
Glucose, Bld: 105 mg/dL — ABNORMAL HIGH (ref 65–99)
POTASSIUM: 4 mmol/L (ref 3.5–5.1)
SODIUM: 138 mmol/L (ref 135–145)
Total Bilirubin: 1 mg/dL (ref 0.3–1.2)
Total Protein: 6.4 g/dL — ABNORMAL LOW (ref 6.5–8.1)

## 2017-01-01 LAB — I-STAT BETA HCG BLOOD, ED (MC, WL, AP ONLY): I-stat hCG, quantitative: >2000 m[IU]/mL — ABNORMAL HIGH (ref <5)

## 2017-01-01 LAB — LIPASE, BLOOD: LIPASE: 22 U/L (ref 11–51)

## 2017-01-01 MED ORDER — TERCONAZOLE 0.4 % VA CREA: 1.0000 | TOPICAL_CREAM | Freq: Every day | VAGINAL | 0 refills | Status: AC

## 2017-01-01 MED ORDER — ONDANSETRON HCL 4 MG PO TABS: 4.0000 mg | ORAL_TABLET | Freq: Three times a day (TID) | ORAL | 0 refills | Status: DC | PRN

## 2017-01-01 MED ORDER — PYRIDOXINE HCL 25 MG PO TABS: 25.0000 mg | ORAL_TABLET | Freq: Three times a day (TID) | ORAL | Status: DC | PRN

## 2017-01-01 MED ORDER — VITAMIN B-6 25 MG PO TABS: 25.0000 mg | ORAL_TABLET | Freq: Three times a day (TID) | ORAL | 0 refills | Status: AC | PRN

## 2017-01-01 NOTE — MAU Note (Signed)
Pt. States that she's having pain and discharge that started about two days ago. Burning and itching started earlier in the day. Pt. States that she would like to determine how many gestational weeks she is.

## 2017-01-01 NOTE — ED Provider Notes (Cosign Needed)
[facility]-EMERGENCY DEPT Provider Note   CSN: [id number] Arrival date & time: 12/31/16  2343     History   Chief Complaint Chief Complaint  Patient presents with  . Abdominal Pain  . Emesis    HPI Mary Chang is a 26 y.o. female.  HPI   Patient states she has been nauseated and has had a difficult keeping foods down for about 1 week. Patient states has been able to keep liquids. Patient states the vomiting is worse in the morning. Denies any abdominal pain, denies any vaginal bleeding. Last menstrual period was last month around the 1st of July. This is patient's second baby   Past Medical History:  Diagnosis Date  . Sleep apnea   . UTI (lower urinary tract infection)     Patient Active Problem List   Diagnosis Date Noted  . Excessive postpartum bleeding 01/10/2014  . Severe obesity (BMI >= 40) (HCC) 01/09/2014  . Hx of pulmonary edema--pp 01/09/2014  . NSVD (normal spontaneous vaginal delivery) 01/09/2014  . Sleep apnea 02/03/2011    Past Surgical History:  Procedure Laterality Date  . NO PAST SURGERIES      OB History    Gravida Para Term Preterm AB Living   3 2 1 1 1 2    SAB TAB Ectopic Multiple Live Births   0 1 0 0 2       Home Medications    Prior to Admission medications   Medication Sig Start Date End Date Taking? Authorizing Provider  ferrous sulfate 325 (65 FE) MG tablet Take 1 tablet (325 mg total) by mouth daily with breakfast. 01/11/14   Nigel BridgemanLatham, Vicki, CNM  ibuprofen (ADVIL,MOTRIN) 600 MG tablet Take 1 tablet (600 mg total) by mouth every 6 (six) hours as needed. 01/11/14   Nigel BridgemanLatham, Vicki, CNM  norethindrone (ORTHO MICRONOR) 0.35 MG tablet Take 1 tablet (0.35 mg total) by mouth daily. 02/05/14   Nigel BridgemanLatham, Vicki, CNM  oxyCODONE-acetaminophen (PERCOCET/ROXICET) 5-325 MG per tablet Take 1 tablet by mouth every 4 (four) hours as needed (for pain scale less than 7). 01/11/14   Nigel BridgemanLatham, Vicki, CNM  Prenatal Vit-Fe Fumarate-FA (PRENATAL MULTIVITAMIN) TABS  tablet Take 1 tablet by mouth daily at 12 noon.    [provider]    Family History Family History  Problem Relation Age of Onset  . Hypertension Father   . Diabetes Father   . Heart disease Maternal Grandmother   . Hypertension Maternal Grandmother   . Diabetes Paternal Grandfather   . Hypertension Paternal Grandfather     Social History Social History  Substance Use Topics  . Smoking status: Former Smoker    Types: Cigarettes    Quit date: 05/30/2013  . Smokeless tobacco: Never Used     Comment: Started smoking at age 26.  Currently smoking approx 2 cig/week  . Alcohol use No     Comment: states she used MJ on and off through 8th month for nausea     Allergies   Patient has no known allergies.   Review of Systems Review of Systems  Constitutional: Negative for chills and fever.  HENT: Negative for congestion and sore throat.   Respiratory: Negative for chest tightness and shortness of breath.   Gastrointestinal: Positive for nausea and vomiting. Negative for abdominal pain and diarrhea.  Genitourinary: Negative for dysuria.     Physical Exam Updated Vital Signs BP (!) 111/57   Pulse 83   Temp 98.4 F (36.9 C) (Oral)   Resp 18  Ht  (1.651 m)   Wt 74.8 kg (165 lb)   LMP 11/26/2016 (Approximate)   SpO2 (!) 83%   BMI 27.46 kg/m   Physical Exam  Constitutional: She is oriented to person, place, and time. She appears well-developed and well-nourished.  HENT:  Head: Normocephalic and atraumatic.  Eyes: Pupils are equal, round, and reactive to light.  Neck: Normal range of motion. Neck supple.  Cardiovascular: Normal rate and regular rhythm.   Pulmonary/Chest: Effort normal and breath sounds normal.  Abdominal: Soft. Bowel sounds are normal.  Musculoskeletal: Normal range of motion.  Neurological: She is alert and oriented to person, place, and time.  Skin: Skin is warm. Capillary refill takes less than 2 seconds.     ED Treatments /  Results  Labs (all labs ordered are listed, but only abnormal results are displayed) Labs Reviewed  COMPREHENSIVE METABOLIC PANEL - Abnormal; Notable for the following:       Result Value   Glucose, Bld 105 (*)    Calcium 8.8 (*)    Total Protein 6.4 (*)    ALT 10 (*)    All other components within normal limits  I-STAT BETA HCG BLOOD, ED ([facility], [facility], [facility] ONLY) - Abnormal; Notable for the following:    I-stat hCG, quantitative >2,000.0 (*)    All other components within normal limits  LIPASE, BLOOD  CBC  URINALYSIS, ROUTINE W REFLEX MICROSCOPIC    EKG  EKG Interpretation None       Radiology No results found.  Procedures Procedures (including critical care time)  Medications Ordered in ED Medications - No data to display   Initial Impression / Assessment and Plan / ED Course  I have reviewed the triage vital signs and the nursing notes.  Pertinent labs & imaging results that were available during my care of the patient were reviewed by me and considered in my medical decision making (see chart for details).    Patient presenting with nausea and vomiting. Denies any abdominal pain or diarrhea. Positive pregnancy test. No pelvic pain, no vaginal bleeding therefore no concern for tubal pregnancy. No history of diarrhea therefore unlikely gastroenteritis. Negative UA and no dysuria urgency or frequency therefore unlikely UTI. Patient that she needs to follow up with OB/GYN for prenatal care. Patient eloped before I was able to provide AVS and treatment plan. Attending was not able to see the patient   Final Clinical Impressions(s) / ED Diagnoses   Final diagnoses:  None    New Prescriptions New Prescriptions   No medications on file     Berton Bon, MD 01/01/17 [id number]

## 2017-01-01 NOTE — MAU Provider Note (Signed)
History     CSN: [id number]  Arrival date and time: 01/01/17 1700   First Provider Initiated Contact with Patient 01/01/17 1855      Chief Complaint  Patient presents with  . Vaginal Discharge    yeast infection   HPI  [name] is a 26 y.o. [phone] @ [redacted]w[redacted]d female here in MAU with vaginal discharge. The discharge started this morning. She tried to be seen at [facility] ER however she waited to long to be seen. The discharge is white and she feels she is irritated around her vagina.  The discharge is non-odorous. She denies vaginal bleeding or abdominal pain.   OB History    Gravida Para Term Preterm AB Living   SAB TAB Ectopic Multiple Live Births   0 1 0 0 2      Past Medical History:  Diagnosis Date  . Sleep apnea   . UTI (lower urinary tract infection)     Past Surgical History:  Procedure Laterality Date  . NO PAST SURGERIES      Family History  Problem Relation Age of Onset  . Hypertension Father   . Diabetes Father   . Heart disease Maternal Grandmother   . Hypertension Maternal Grandmother   . Diabetes Paternal Grandfather   . Hypertension Paternal Grandfather     Social History  Substance Use Topics  . Smoking status: Former Smoker    Types: Cigarettes    Quit date: 05/30/2013  . Smokeless tobacco: Never Used     Comment: Started smoking at age 35.  Currently smoking approx 2 cig/week  . Alcohol use No     Comment: states she used MJ on and off through 8th month for nausea    Allergies: No Known Allergies  Prescriptions Prior to Admission  Medication Sig Dispense Refill Last Dose  . Ibuprofen (ADVIL MIGRAINE) 200 MG CAPS Take by mouth.   01/01/2017 at Unknown time  . ferrous sulfate 325 (65 FE) MG tablet Take 1 tablet (325 mg total) by mouth daily with breakfast. 30 tablet 1   . ibuprofen (ADVIL,MOTRIN) 600 MG tablet Take 1 tablet (600 mg total) by mouth every 6 (six) hours as needed. 30 tablet 2   . norethindrone (ORTHO MICRONOR) 0.35  MG tablet Take 1 tablet (0.35 mg total) by mouth daily. 1 Package 11   . ondansetron (ZOFRAN) 4 MG tablet Take 1 tablet (4 mg total) by mouth every 8 (eight) hours as needed for nausea or vomiting. 10 tablet 0   . oxyCODONE-acetaminophen (PERCOCET/ROXICET) 5-325 MG per tablet Take 1 tablet by mouth every 4 (four) hours as needed (for pain scale less than 7). 30 tablet 0   . Prenatal Vit-Fe Fumarate-FA (PRENATAL MULTIVITAMIN) TABS tablet Take 1 tablet by mouth daily at 12 noon.   01/08/2014 at Unknown time  . vitamin B-6 (PYRIDOXINE) 25 MG tablet Take 1 tablet (25 mg total) by mouth 3 (three) times daily as needed (nausea/vomiting). 30 tablet 0    Results for orders placed or performed during the hospital encounter of 01/01/17 (from the past 48 hour(s))  Wet prep, genital     Status: Abnormal   Collection Time: 01/01/17  7:00 PM  Result Value Ref Range   Yeast Wet Prep HPF POC NONE SEEN NONE SEEN   Trich, Wet Prep NONE SEEN NONE SEEN   Clue Cells Wet Prep HPF POC NONE SEEN NONE SEEN   WBC, Wet Prep HPF POC  MODERATE (A) NONE SEEN    Comment: MODERATE BACTERIA SEEN   Sperm NONE SEEN    Review of Systems  Constitutional: Negative for fever.  Gastrointestinal: Negative for abdominal pain, constipation, nausea and rectal pain.  Genitourinary: Positive for vaginal discharge. Negative for dysuria and vaginal bleeding.   Physical Exam   Blood pressure 106/66, pulse 66, temperature 98.3 F (36.8 C), temperature source Oral, resp. rate 16, height 5\' 5"  (1.651 m), weight 75.8 kg (167 lb), last menstrual period 11/27/2016, unknown if currently breastfeeding.  Physical Exam  Constitutional: She is oriented to person, place, and time. She appears well-developed and well-nourished. No distress.  HENT:  Head: Normocephalic.  Eyes: Pupils are equal, round, and reactive to light.  Genitourinary:  Genitourinary Comments: Vagina - Small amount of thick,  white vaginal discharge, no odor  Cervix - No  contact bleeding, no active bleeding  Bimanual exam: Cervix closed Uterus non tender, enlarged  Adnexa non tender, no masses bilaterally GC/Chlam, wet prep done Chaperone present for exam.   Musculoskeletal: Normal range of motion.  Neurological: She is alert and oriented to person, place, and time.  Skin: Skin is warm. She is not diaphoretic.  Psychiatric: Her behavior is normal.   MAU Course  Procedures  None  MDM  Wet prep GC UA   Assessment and Plan   A:  1. Yeast vaginitis   2. Vaginal discharge during pregnancy in first trimester     P:  Discharge home in stable condition Rx: Terazol Return to MAU if symptoms worsen Start prenatal care  Rasch, Harolyn RutherfordJennifer I, NP 01/01/2017 8:11 PM

## 2017-01-01 NOTE — ED Notes (Signed)
Pt departed AMA and without speaking with any staff beforehand.

## 2017-01-01 NOTE — Discharge Instructions (Signed)

## 2017-01-02 LAB — GC/CHLAMYDIA PROBE AMP (~~LOC~~) NOT AT ARMC
Chlamydia: NEGATIVE
Neisseria Gonorrhea: NEGATIVE

## 2017-01-14 ENCOUNTER — Encounter (HOSPITAL_COMMUNITY): Payer: Self-pay | Admitting: *Deleted

## 2017-01-14 ENCOUNTER — Inpatient Hospital Stay (HOSPITAL_COMMUNITY): Payer: Medicaid Other

## 2017-01-14 ENCOUNTER — Inpatient Hospital Stay (HOSPITAL_COMMUNITY)
Admission: AD | Admit: 2017-01-14 | Discharge: 2017-01-15 | Disposition: A | Discharge status: Home / Self Care | Payer: Medicaid Other | Source: Ambulatory Visit | Attending: Obstetrics & Gynecology | Admitting: Obstetrics & Gynecology

## 2017-01-14 DIAGNOSIS — Z3A01 Less than 8 weeks gestation of pregnancy: Secondary | ICD-10-CM | POA: Diagnosis present

## 2017-01-14 DIAGNOSIS — O210 Mild hyperemesis gravidarum: Secondary | ICD-10-CM | POA: Diagnosis present

## 2017-01-14 DIAGNOSIS — N8312 Corpus luteum cyst of left ovary: Secondary | ICD-10-CM | POA: Insufficient documentation

## 2017-01-14 DIAGNOSIS — O99211 Obesity complicating pregnancy, first trimester: Secondary | ICD-10-CM | POA: Diagnosis not present

## 2017-01-14 DIAGNOSIS — Z87440 Personal history of urinary (tract) infections: Secondary | ICD-10-CM | POA: Insufficient documentation

## 2017-01-14 DIAGNOSIS — G4730 Sleep apnea, unspecified: Secondary | ICD-10-CM | POA: Diagnosis not present

## 2017-01-14 DIAGNOSIS — O3481 Maternal care for other abnormalities of pelvic organs, first trimester: Secondary | ICD-10-CM | POA: Insufficient documentation

## 2017-01-14 DIAGNOSIS — O208 Other hemorrhage in early pregnancy: Secondary | ICD-10-CM | POA: Diagnosis not present

## 2017-01-14 DIAGNOSIS — Z87891 Personal history of nicotine dependence: Secondary | ICD-10-CM | POA: Diagnosis not present

## 2017-01-14 DIAGNOSIS — O99351 Diseases of the nervous system complicating pregnancy, first trimester: Secondary | ICD-10-CM | POA: Diagnosis not present

## 2017-01-14 LAB — CBC WITH DIFFERENTIAL/PLATELET
Basophils Absolute: 0 K/uL (ref 0.0–0.1)
Basophils Relative: 0 %
Eosinophils Absolute: 0.1 K/uL (ref 0.0–0.7)
Eosinophils Relative: 1 %
HCT: 40 % (ref 36.0–46.0)
Hemoglobin: 13.6 g/dL (ref 12.0–15.0)
Lymphocytes Relative: 24 %
Lymphs Abs: 2.2 K/uL (ref 0.7–4.0)
MCH: 29.4 pg (ref 26.0–34.0)
MCHC: 34 g/dL (ref 30.0–36.0)
MCV: 86.4 fL (ref 78.0–100.0)
Monocytes Absolute: 0.5 K/uL (ref 0.1–1.0)
Monocytes Relative: 5 %
Neutro Abs: 6.3 K/uL (ref 1.7–7.7)
Neutrophils Relative %: 70 %
Platelets: 234 K/uL (ref 150–400)
RBC: 4.63 MIL/uL (ref 3.87–5.11)
RDW: 13.6 % (ref 11.5–15.5)
WBC: 9.1 K/uL (ref 4.0–10.5)

## 2017-01-14 LAB — COMPREHENSIVE METABOLIC PANEL WITH GFR
ALT: 15 U/L (ref 14–54)
AST: 16 U/L (ref 15–41)
Albumin: 4.4 g/dL (ref 3.5–5.0)
Alkaline Phosphatase: 48 U/L (ref 38–126)
Anion gap: 7 (ref 5–15)
BUN: 9 mg/dL (ref 6–20)
CO2: 28 mmol/L (ref 22–32)
Calcium: 9.4 mg/dL (ref 8.9–10.3)
Chloride: 101 mmol/L (ref 101–111)
Creatinine, Ser: 0.68 mg/dL (ref 0.44–1.00)
GFR calc Af Amer: >60 mL/min
GFR calc non Af Amer: >60 mL/min
Glucose, Bld: 96 mg/dL (ref 65–99)
Potassium: 3.8 mmol/L (ref 3.5–5.1)
Sodium: 136 mmol/L (ref 135–145)
Total Bilirubin: 0.9 mg/dL (ref 0.3–1.2)
Total Protein: 8 g/dL (ref 6.5–8.1)

## 2017-01-14 LAB — URINALYSIS, ROUTINE W REFLEX MICROSCOPIC
BILIRUBIN URINE: NEGATIVE
Glucose, UA: NEGATIVE mg/dL
HGB URINE DIPSTICK: NEGATIVE
KETONES UR: NEGATIVE mg/dL
Leukocytes, UA: NEGATIVE
NITRITE: NEGATIVE
Protein, ur: NEGATIVE mg/dL
SPECIFIC GRAVITY, URINE: 1.019 (ref 1.005–1.030)
pH: 7 (ref 5.0–8.0)

## 2017-01-14 LAB — LIPASE, BLOOD: Lipase: 19 U/L (ref 11–51)

## 2017-01-14 MED ORDER — FAMOTIDINE IN NACL 20-0.9 MG/50ML-% IV SOLN
20.0000 mg | Freq: Once | INTRAVENOUS | Status: AC
  Administered 2017-01-14: 20 mg via INTRAVENOUS
  Filled 2017-01-14: qty 50

## 2017-01-14 MED ORDER — PROMETHAZINE HCL 12.5 MG PO TABS: 12.5000 mg | ORAL_TABLET | Freq: Four times a day (QID) | ORAL | 0 refills | Status: AC | PRN

## 2017-01-14 MED ORDER — METOCLOPRAMIDE HCL 5 MG/ML IJ SOLN
10.0000 mg | Freq: Once | INTRAMUSCULAR | Status: AC
  Administered 2017-01-14: 10 mg via INTRAVENOUS
  Filled 2017-01-14: qty 2

## 2017-01-14 MED ORDER — SODIUM CHLORIDE 0.9 % IV SOLN
INTRAVENOUS | Status: AC
  Administered 2017-01-14: 500 mL via INTRAVENOUS

## 2017-01-14 NOTE — MAU Provider Note (Signed)
[facility] AT [facility] Provider Note   CSN: [id number] Arrival date & time: 01/14/17  1800     History   Chief Complaint Chief Complaint  Patient presents with  . Nausea  . Emesis  . Hemoptysis  . Fatigue    HPI Mary Chang is a 26 y.o. [id number] @ [redacted]w[redacted]d gestation who presents to MAU with nausea and vomiting. Patient has not taken any medications. Last pap smear 1 year ago and was normal. Hx of Chlamydia exposure 2 months ago and was treated with Zithromax. Partner was also treated.   Emesis   This is a new problem. The current episode started 1 to 4 weeks ago. The problem occurs more than 10 times per day. The problem has been gradually worsening. The emesis has an appearance of bile. There has been no fever. Associated symptoms include abdominal pain, coughing and headaches. Pertinent negatives include no chills or fever. Chest pain: with cough.    Past Medical History:  Diagnosis Date  . Sleep apnea   . UTI (lower urinary tract infection)     Patient Active Problem List   Diagnosis Date Noted  . Excessive postpartum bleeding 01/10/2014  . Severe obesity (BMI >= 40) (HCC) 01/09/2014  . Hx of pulmonary edema--pp 01/09/2014  . NSVD (normal spontaneous vaginal delivery) 01/09/2014  . Sleep apnea 02/03/2011    Past Surgical History:  Procedure Laterality Date  . NO PAST SURGERIES      OB History    Gravida Para Term Preterm AB Living   SAB TAB Ectopic Multiple Live Births   0 1 0 0 2       Home Medications    Prior to Admission medications   Medication Sig Start Date End Date Taking? Authorizing Provider  ferrous sulfate 325 (65 FE) MG tablet Take 1 tablet (325 mg total) by mouth daily with breakfast. 01/11/14   Nigel Bridgeman, CNM  Prenatal Vit-Fe Fumarate-FA (PRENATAL MULTIVITAMIN) TABS tablet Take 1 tablet by mouth daily at 12 noon.    [provider]  promethazine (PHENERGAN) 12.5 MG tablet Take 1 tablet (12.5 mg total) by mouth every 6  (six) hours as needed for nausea or vomiting. 01/14/17   Janne Napoleon, NP  terconazole (TERAZOL 7) 0.4 % vaginal cream Place 1 applicator vaginally at bedtime. 01/01/17   Rasch, Victorino Dike I, NP  vitamin B-6 (PYRIDOXINE) 25 MG tablet Take 1 tablet (25 mg total) by mouth 3 (three) times daily as needed (nausea/vomiting). 01/01/17   Berton Bon, MD    Family History Family History  Problem Relation Age of Onset  . Hypertension Father   . Diabetes Father   . Heart disease Maternal Grandmother   . Hypertension Maternal Grandmother   . Diabetes Paternal Grandfather   . Hypertension Paternal Grandfather     Social History Social History  Substance Use Topics  . Smoking status: Former Smoker    Types: Cigarettes    Quit date: 05/30/2013  . Smokeless tobacco: Never Used     Comment: Started smoking at age 49.  Currently smoking approx 2 cig/week  . Alcohol use No     Comment: states she used MJ on and off through 8th month for nausea     Allergies   Patient has no known allergies.   Review of Systems Review of Systems  Constitutional: Negative for chills and fever.  HENT: Negative.   Eyes: Negative for visual disturbance.  Respiratory: Positive  for cough and shortness of breath.   Cardiovascular: Chest pain: with cough.  Gastrointestinal: Positive for abdominal pain and vomiting.  Genitourinary: Negative for dysuria, frequency, hematuria, vaginal bleeding and vaginal discharge.  Musculoskeletal: Negative for back pain.  Skin: Negative for rash.  Neurological: Positive for headaches. Negative for syncope.  Psychiatric/Behavioral: Negative for confusion. The patient is not nervous/anxious.      Physical Exam Updated Vital Signs BP 114/74 (BP Location: Right Arm)   Pulse 71   Temp 98.4 F (36.9 C)   Resp 17   Wt 165 lb 12 oz (75.2 kg)   LMP 11/27/2016 (LMP Unknown)   SpO2 100%   BMI 27.58 kg/m   Physical Exam  Constitutional: She is oriented to person, place, and  time. She appears well-developed and well-nourished. No distress.  HENT:  Head: Normocephalic and atraumatic.  Eyes: EOM are normal.  Neck: Neck supple.  Cardiovascular: Normal rate and regular rhythm.   Pulmonary/Chest: Effort normal and breath sounds normal.  Abdominal: Soft. Bowel sounds are normal. There is tenderness.  Genitourinary:  Genitourinary Comments: Pelvic exam with cultures done 01/01/17 and not repeated tonight.   Musculoskeletal: Normal range of motion.  Neurological: She is alert and oriented to person, place, and time. No cranial nerve deficit.  Skin: Skin is warm and dry.  Psychiatric: She has a normal mood and affect. Her behavior is normal.  Nursing note and vitals reviewed.    ED Treatments / Results  Labs (all labs ordered are listed, but only abnormal results are displayed) Labs Reviewed  URINALYSIS, ROUTINE W REFLEX MICROSCOPIC - Abnormal; Notable for the following:       Result Value   APPearance CLOUDY (*)    All other components within normal limits  CBC WITH DIFFERENTIAL/PLATELET  COMPREHENSIVE METABOLIC PANEL  LIPASE, BLOOD    Radiology US Ob Comp Less 14 Wks  Result Date: 01/14/2017 CLINICAL DATA:  Pain during pregnancy. Vomiting. Estimated gestational age by LMP is 6 weeks 6 days. Quantitative beta HCG was not obtained. EXAM: OBSTETRIC <14 WK [place] AND TRANSVAGINAL OB US TECHNIQUE: Both transabdominal and transvaginal ultrasound examinations were performed for complete evaluation of the gestation as well as the maternal uterus, adnexal regions, and pelvic cul-de-sac. Transvaginal technique was performed to assess early pregnancy. COMPARISON:  None. FINDINGS: Intrauterine gestational sac: A single intrauterine gestational sac is identified. Yolk sac:  Yolk sac is present. Embryo:  Fetal pole is present. Cardiac Activity: Fetal cardiac activity is observed. Heart Rate: 133  bpm CRL:  8  mm   6 w   4 d                  [place] EDC: 09/05/2017 Subchorionic  hemorrhage: A small subchorionic hemorrhage is demonstrated anteriorly. Maternal uterus/adnexae: The uterus is anteverted. No myometrial mass lesion is identified. Both ovaries are visualized and appear normal. Small corpus luteal cyst seen on the left ovary. Flow is demonstrated in both ovaries on color flow Doppler imaging. No free fluid in the pelvis. IMPRESSION: Single intrauterine pregnancy. Estimated gestational age by LMP is 6 weeks 4 days. A small subchorionic hemorrhage is demonstrated. Electronically Signed   By: Burman Nieves M.D.   On: 01/14/2017 23:30   US Ob Transvaginal  Result Date: 01/14/2017 CLINICAL DATA:  Pain during pregnancy. Vomiting. Estimated gestational age by LMP is 6 weeks 6 days. Quantitative beta HCG was not obtained. EXAM: OBSTETRIC <14 WK [place] AND TRANSVAGINAL OB US TECHNIQUE: Both transabdominal and transvaginal ultrasound  examinations were performed for complete evaluation of the gestation as well as the maternal uterus, adnexal regions, and pelvic cul-de-sac. Transvaginal technique was performed to assess early pregnancy. COMPARISON:  None. FINDINGS: Intrauterine gestational sac: A single intrauterine gestational sac is identified. Yolk sac:  Yolk sac is present. Embryo:  Fetal pole is present. Cardiac Activity: Fetal cardiac activity is observed. Heart Rate: 133  bpm CRL:  8  mm   6 w   4 d                  [place] EDC: 09/05/2017 Subchorionic hemorrhage: A small subchorionic hemorrhage is demonstrated anteriorly. Maternal uterus/adnexae: The uterus is anteverted. No myometrial mass lesion is identified. Both ovaries are visualized and appear normal. Small corpus luteal cyst seen on the left ovary. Flow is demonstrated in both ovaries on color flow Doppler imaging. No free fluid in the pelvis. IMPRESSION: Single intrauterine pregnancy. Estimated gestational age by LMP is 6 weeks 4 days. A small subchorionic hemorrhage is demonstrated. Electronically Signed   By: Burman Nieves  M.D.   On: 01/14/2017 23:30    Procedures Procedures (including critical care time)  Medications Ordered in ED Medications  0.9 %  sodium chloride infusion (500 mLs Intravenous New Bag/Given 01/14/17 2120)  metoCLOPramide (REGLAN) injection 10 mg (10 mg Intravenous Given 01/14/17 2121)  famotidine (PEPCID) IVPB 20 mg premix (20 mg Intravenous New Bag/Given 01/14/17 2125)     Initial Impression / Assessment and Plan / ED Course  I have reviewed the triage vital signs and the nursing notes.  Pertinent labs & imaging results that were available during my care of the patient were reviewed by me and considered in my medical decision making (see chart for details). 26 y.o. [id number] @ [redacted]w[redacted]d gestation with nausea and vomiting stable for d/c after IV hydration and medication for n/v. Patient states she is feeling better and requesting food and drink. She will f/u @ [facility] to start her prenatal care and return here for any problems.   Final Clinical Impressions(s) / ED Diagnoses   Final diagnoses:  Hyperemesis gravidarum    New Prescriptions Current Discharge Medication List    START taking these medications   Details  promethazine (PHENERGAN) 12.5 MG tablet Take 1 tablet (12.5 mg total) by mouth every 6 (six) hours as needed for nausea or vomiting. Qty: 30 tablet, Refills: 0

## 2017-01-14 NOTE — Discharge Instructions (Signed)
Morning Sickness Morning sickness is when you feel sick to your stomach (nauseous) during pregnancy. You may feel sick to your stomach and throw up (vomit). You may feel sick in the morning, but you can feel this way any time of day. Some women feel very sick to their stomach and cannot stop throwing up (hyperemesis gravidarum). Follow these instructions at home:  Only take medicines as told by your doctor.  Take multivitamins as told by your doctor. Taking multivitamins before getting pregnant can stop or lessen the harshness of morning sickness.  Eat dry toast or unsalted crackers before getting out of bed.  Eat 5 to 6 small meals a day.  Eat dry and bland foods like rice and baked potatoes.  Do not drink liquids with meals. Drink between meals.  Do not eat greasy, fatty, or spicy foods.  Have someone cook for you if the smell of food causes you to feel sick or throw up.  If you feel sick to your stomach after taking prenatal vitamins, take them at night or with a snack.  Eat protein when you need a snack (nuts, yogurt, cheese).  Eat unsweetened gelatins for dessert.  Wear a bracelet used for sea sickness (acupressure wristband).  Go to a doctor that puts thin needles into certain body points (acupuncture) to improve how you feel.  Do not smoke.  Use a humidifier to keep the air in your house free of odors.  Get lots of fresh air. Contact a doctor if:  You need medicine to feel better.  You feel dizzy or lightheaded.  You are losing weight. Get help right away if:  You feel very sick to your stomach and cannot stop throwing up.  You pass out (faint). This information is not intended to replace advice given to you by your health care provider. Make sure you discuss any questions you have with your health care provider. Document Released: 05/22/2004 Document Revised: 09/20/2015 Document Reviewed: 09/29/2012 Elsevier Interactive Patient Education  2017 [employer]   Hyperemesis Gravidarum Hyperemesis gravidarum is a severe form of nausea and vomiting that happens during pregnancy. Hyperemesis is worse than morning sickness. It may cause you to have nausea or vomiting all day for many days. It may keep you from eating and drinking enough food and liquids. Hyperemesis usually occurs during the first half (the first 20 weeks) of pregnancy. It often goes away once a woman is in her second half of pregnancy. However, sometimes hyperemesis continues through an entire pregnancy. What are the causes? The cause of this condition is not known. It may be related to changes in chemicals (hormones) in the body during pregnancy, such as the high level of pregnancy hormone (human chorionic gonadotropin) or the increase in the female sex hormone (estrogen). What are the signs or symptoms? Symptoms of this condition include:  Severe nausea and vomiting.  Nausea that does not go away.  Vomiting that does not allow you to keep any food down.  Weight loss.  Body fluid loss (dehydration).  Having no desire to eat, or not liking food that you have previously enjoyed.  How is this diagnosed? This condition may be diagnosed based on:  A physical exam.  Your medical history.  Your symptoms.  Blood tests.  Urine tests.  How is this treated? This condition may be managed with medicine. If medicines to do not help relieve nausea and vomiting, you may need to receive fluids through an IV tube at the hospital. Follow  these instructions at home:  Take over-the-counter and prescription medicines only as told by your health care provider.  Avoid iron pills and multivitamins that contain iron for the first 3-4 months of pregnancy. If you take prescription iron pills, do not stop taking them unless your health care provider approves.  Take the following actions to help prevent nausea and vomiting: ? In the morning, before getting out of bed, try eating a couple  of dry crackers or a piece of toast. ? Avoid foods and smells that upset your stomach. Fatty and spicy foods may make nausea worse. ? Eat 5-6 small meals a day. ? Do not drink fluids while eating meals. Drink between meals. ? Eat or suck on things that have ginger in them. Ginger can help relieve nausea. ? Avoid food preparation. The smell of food can spoil your appetite or trigger nausea.  Follow instructions from your health care provider about eating or drinking restrictions.  For snacks, eat high-protein foods, such as cheese.  Keep all follow-up and pre-birth (prenatal) visits as told by your health care provider. This is important. Contact a health care provider if:  You have pain in your abdomen.  You have a severe headache.  You have vision problems.  You are losing weight. Get help right away if:  You cannot drink fluids without vomiting.  You vomit blood.  You have constant nausea and vomiting.  You are very weak.  You are very thirsty.  You feel dizzy.  You faint.  You have a fever or other symptoms that last for more than 2-3 days.  You have a fever and your symptoms suddenly get worse. Summary  Hyperemesis gravidarum is a severe form of nausea and vomiting that happens during pregnancy.  Making some changes to your eating habits may help relieve nausea and vomiting.  This condition may be managed with medicine.  If medicines to do not help relieve nausea and vomiting, you may need to receive fluids through an IV tube at the hospital. This information is not intended to replace advice given to you by your health care provider. Make sure you discuss any questions you have with your health care provider. Document Released: 04/14/2005 Document Revised: 12/12/2015 Document Reviewed: 12/12/2015 Elsevier Interactive Patient Education  2017 [employer].

## 2017-01-14 NOTE — MAU Note (Signed)
Report given, Lanier Ensign, RN.

## 2017-01-14 NOTE — MAU Note (Signed)
Been throwing up the last couple days.  Had severe morning sickness with other 2 kids.  Can't keep food or water down.  Throwing up blood.  Is nauseous, is hungry, fatigued.  Body is shaking.

## 2017-10-16 IMAGING — US US OB COMP LESS 14 WK
1 series · 15 of 28 positions shown · non-contrast
Comparison: None.

CLINICAL DATA: Pain during pregnancy. Vomiting. Estimated
gestational age by LMP is 6 weeks 6 days. Quantitative beta HCG was
not obtained.

EXAM:
OBSTETRIC <14 WK US AND TRANSVAGINAL OB US
TECHNIQUE: Both transabdominal and transvaginal ultrasound examinations were
performed for complete evaluation of the gestation as well as the
maternal uterus, adnexal regions, and pelvic cul-de-sac.
Transvaginal technique was performed to assess early pregnancy.

[Series 1: us ob comp less 14 wk · 15 of 79 slices shown]
[im 1/79]
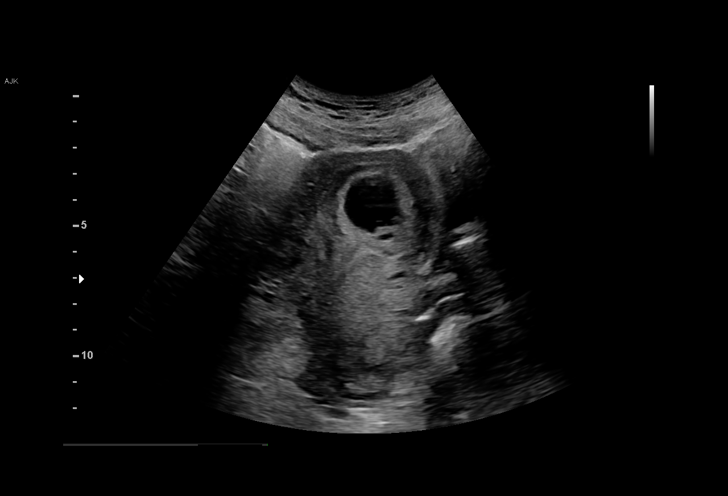
[im 6/79]
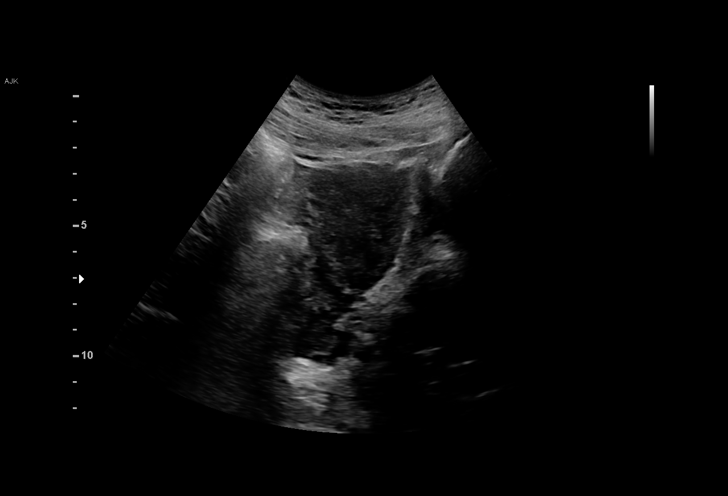
[im 12/79]
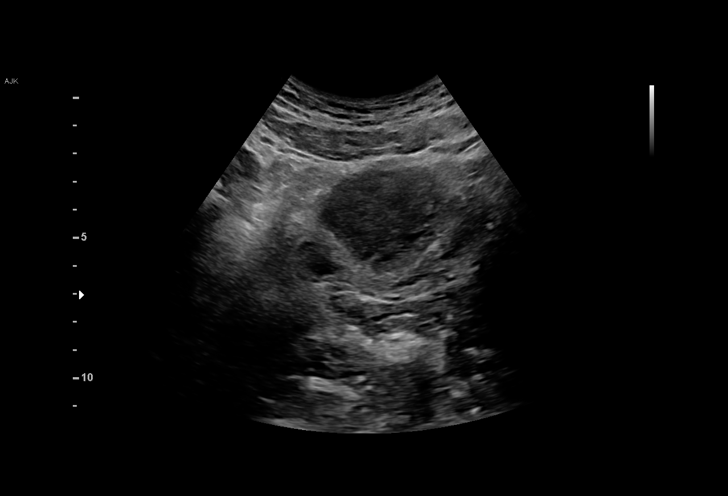
[im 18/79]
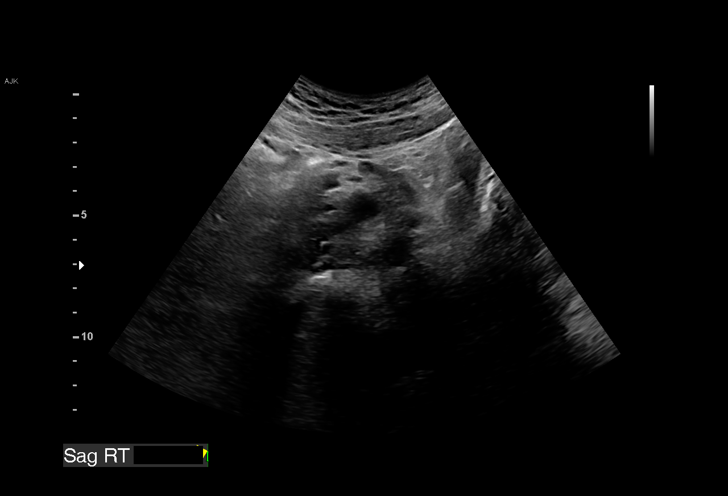
[im 24/79]
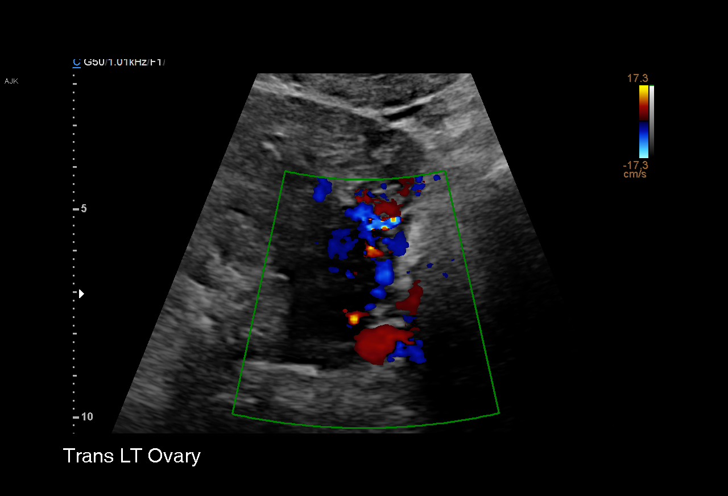
[im 29/79]
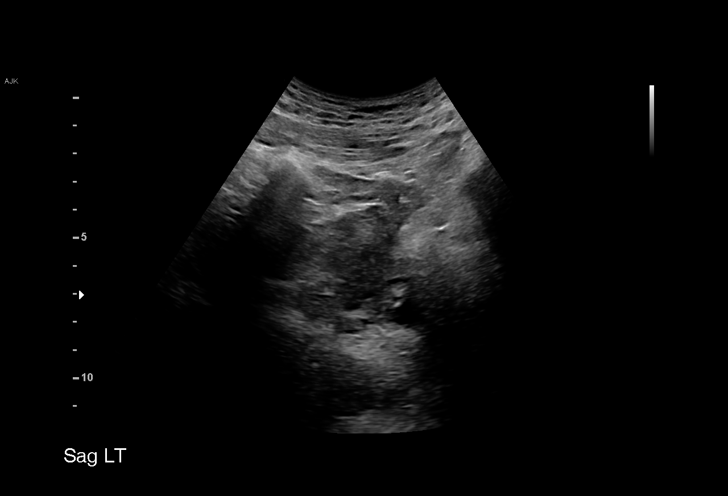
[im 35/79]
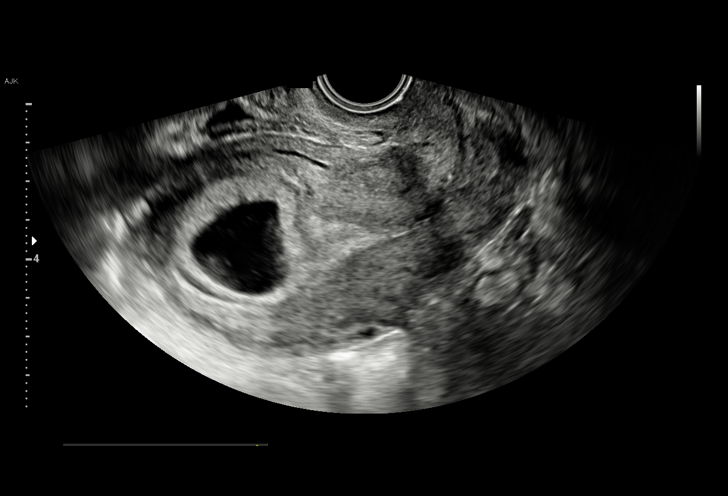
[im 41/79]
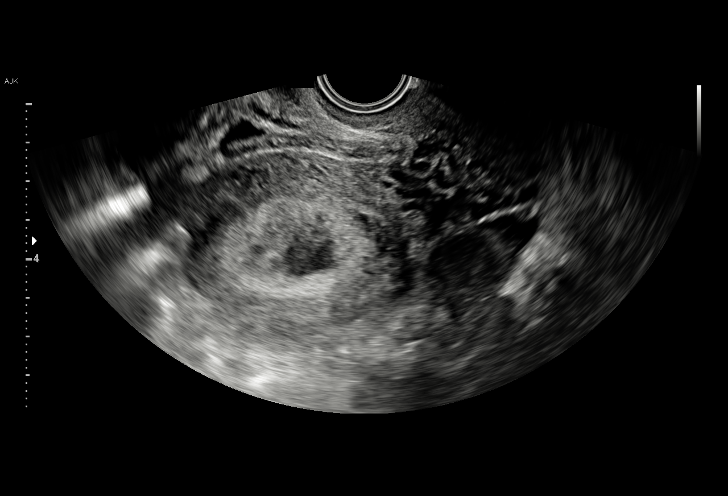
[im 44/79]
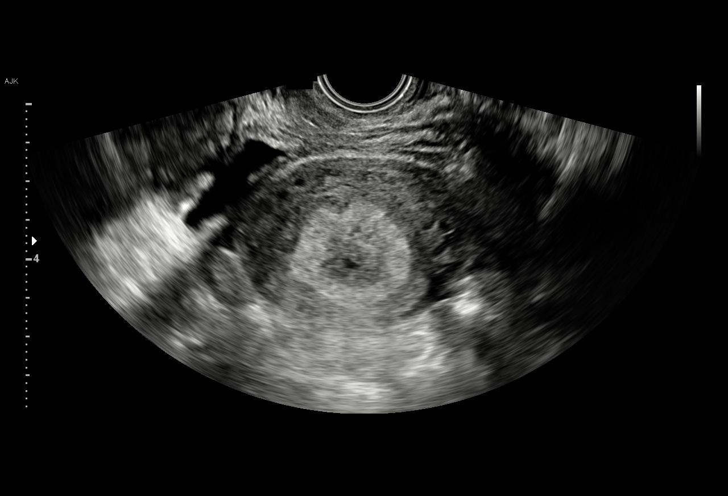
[im 50/79]
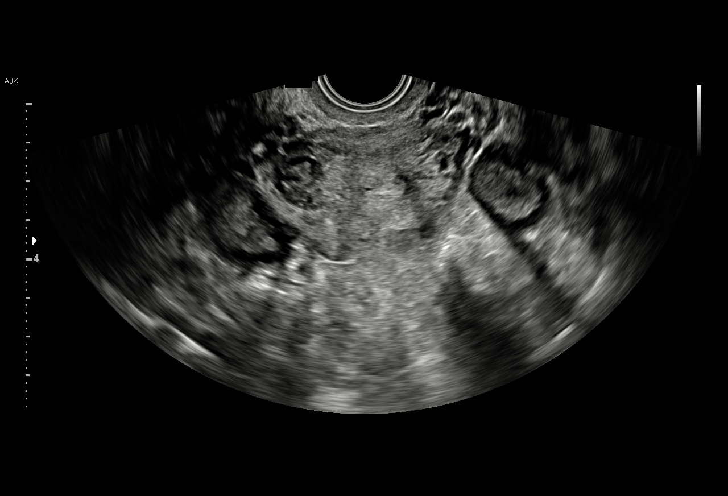
[im 55/79]
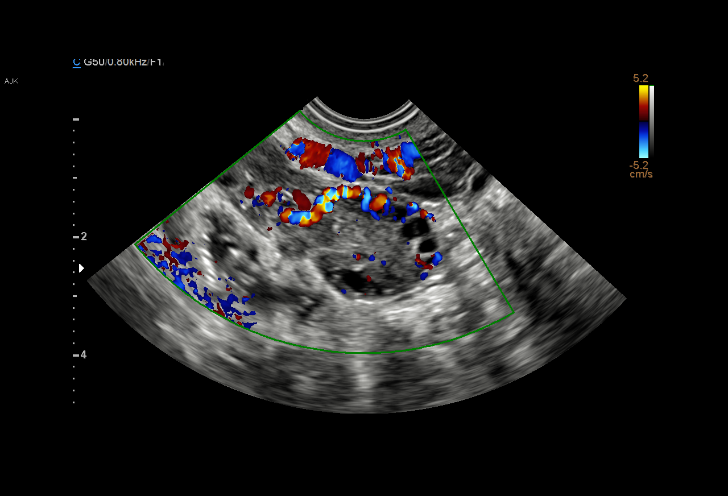
[im 61/79]
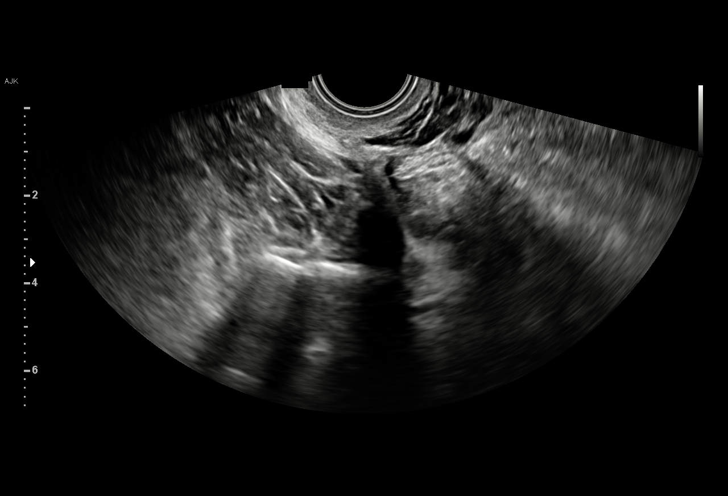
[im 67/79]
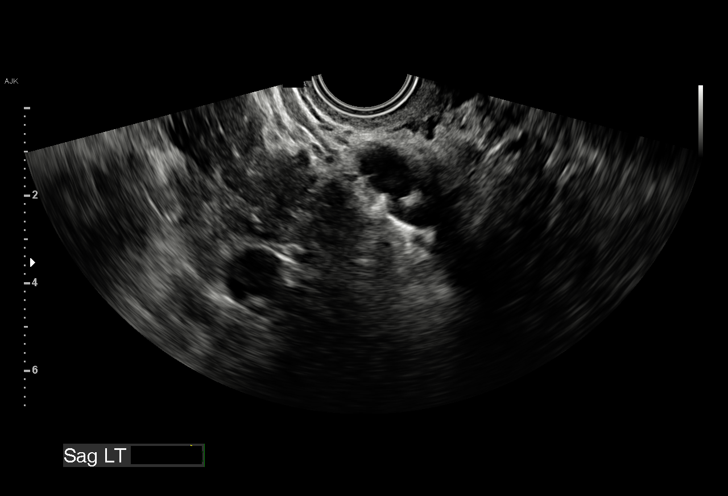
[im 73/79]
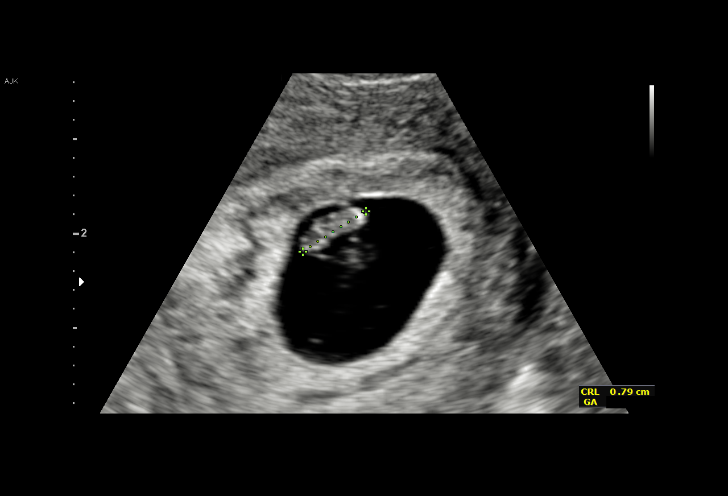
[im 79/79]
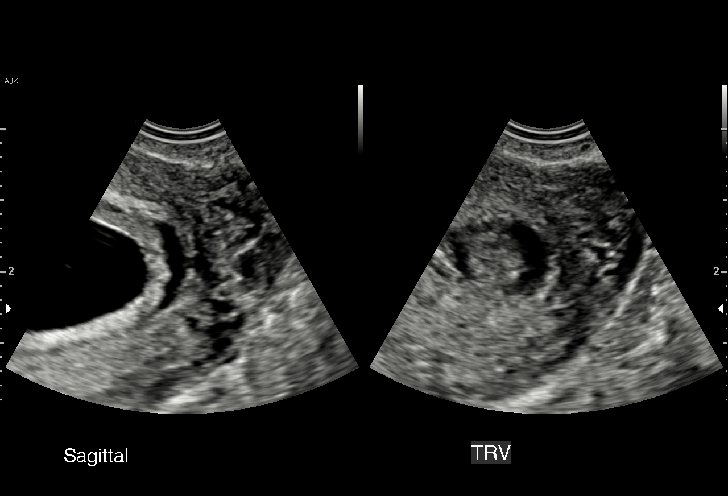

[15 of 28 positions shown; findings below may reference images not displayed]

FINDINGS: Intrauterine gestational sac: A single intrauterine gestational sac
is identified.

Yolk sac:  Yolk sac is present.

Embryo:  Fetal pole is present.

Cardiac Activity: Fetal cardiac activity is observed.

Heart Rate: 133  bpm

CRL:  8  mm   6 w   4 d                  US EDC: 09/05/2017

Subchorionic hemorrhage: A small subchorionic hemorrhage is
demonstrated anteriorly.

Maternal uterus/adnexae: The uterus is anteverted. No myometrial
mass lesion is identified. Both ovaries are visualized and appear
normal. Small corpus luteal cyst seen on the left ovary. Flow is
demonstrated in both ovaries on color flow Doppler imaging. No free
fluid in the pelvis.
IMPRESSION: Single intrauterine pregnancy. Estimated gestational age by LMP is 6
weeks 4 days. A small subchorionic hemorrhage is demonstrated.

## 2017-11-26 ENCOUNTER — Encounter (HOSPITAL_COMMUNITY): Payer: Self-pay

## 2020-06-28 ENCOUNTER — Emergency Department

## 2020-06-28 ENCOUNTER — Emergency Department
Admission: EM | Admit: 2020-06-28 | Discharge: 2020-06-28 | Disposition: A | Discharge status: Home / Self Care | Attending: Emergency Medicine | Admitting: Emergency Medicine

## 2020-06-28 DIAGNOSIS — Z3A01 Less than 8 weeks gestation of pregnancy: Secondary | ICD-10-CM | POA: Insufficient documentation

## 2020-06-28 DIAGNOSIS — O209 Hemorrhage in early pregnancy, unspecified: Secondary | ICD-10-CM | POA: Insufficient documentation

## 2020-06-28 LAB — HCG QUANTITATIVE: BHCG Quant.: 43415.9 m[IU]/mL

## 2020-06-28 LAB — HEMOGLOBIN AND HEMATOCRIT, BLOOD
Hematocrit: 43.3 % (ref 36.0–48.0)
Hemoglobin: 14.5 gm/dL (ref 12.0–16.0)

## 2020-06-28 LAB — ABO/RH: ABO Rh: O POS

## 2020-06-28 MED ORDER — ONDANSETRON 4 MG PO TBDP
4.0000 mg | ORAL_TABLET | Freq: Once | ORAL | Status: AC
Start: 2020-06-28 — End: 2020-06-28
  Administered 2020-06-28: 18:00:00 4 mg via ORAL

## 2020-06-28 MED ORDER — ONDANSETRON 4 MG PO TBDP
ORAL_TABLET | ORAL | Status: AC
Start: 2020-06-28 — End: ?
  Filled 2020-06-28: qty 1

## 2020-06-28 NOTE — Discharge Instructions (Signed)
Bleeding During Early Pregnancy  If youve had bleeding early in your pregnancy, youre not alone. Many other pregnant women have early bleeding, too. And in most cases, nothing is wrong. But your healthcare provider still needs to know about it. They may want to do tests to find out why youre bleeding. Call your provider if you see bleeding during pregnancy. Tell your provider if your blood is Rh negative. Then they can figure out if you need anti-D immune globulin treatment.  What causes early bleeding?  The cause of bleeding early in pregnancy is often unknown. But many factors early on in pregnancy may lead to light bleeding (called spotting) or heavier bleeding. These include:   Having sex   When the embryo implants on the uterine wall   Bleeding between the sac membrane and the uterus (subchorionic bleeding)   Pregnancy loss (miscarriage)   The embryo implants outside of the uterus (ectopic pregnancy)  If you see spotting  Light bleeding is the most common type of bleeding in early pregnancy. If you see it, call your healthcare provider. Chances are, they will tell you that you can care for yourself at home.  If tests are needed  Depending on how much you bleed, your healthcare provider may ask you to come in for some tests. A pelvic exam, for instance, can help see how far along your pregnancy is. You also may have an ultrasound or a Doppler test. These imaging tests use sound waves to check the health of your baby. The ultrasound may be done on your belly or inside your vagina. You may also need a special blood test. This test compares your hormone levels in blood samples taken 2 days apart. The results can help your provider learn more about the implantation of the embryo. Your blood type will also need to be checked to assess if you will need to be treated for Rh sensitization.  Warning signs  If your bleeding doesnt stop or if you have any of the following, get medical care right  away:   Soaking a sanitary pad each hour   Bleeding like youre having a period   Cramping or severe belly pain   Feeling dizzy or faint   Tissue passing through your vagina   Bleeding at any time after the first trimester  Questions you may be asked  Bleeding early in pregnancy isn't normal. But it is common. If youve seen any bleeding, you may be concerned. But keep in mind that bleeding alone doesnt mean something is wrong. Just be sure to call your healthcare provider right away. They may ask you questions like these to help find the cause of your bleeding:   When did your bleeding start?   Is your bleeding very light or is it like a period?   Is the blood bright red or brownish?   Have you had sex recently?   Have you had pain or cramping?   Have you felt dizzy or faint?  Monitoring your pregnancy  Bleeding will often stop as quickly as it began. Your pregnancy may go on a normal path again. You may need to make a few extra prenatal visits. But you and your baby will most likely be fine.  [employer] last reviewed this educational content on 05/29/2018     2000-2021 [employer] [place], [place]. All rights reserved. This information is not intended as a substitute for professional medical care. Always follow your healthcare professional's instructions.    Thank you  for choosing [facility] for your emergency care needs. We strive to provide EXCELLENT care to you and your family.      YOUR ACCURATE CONTACT INFORMATION IS VERY IMPORTANT    Before leaving please check with registration to make sure we have an up-to-date contact number. If you have billing or insurance questions, call [phone].    IF YOU DO NOT CONTINUE TO IMPROVE OR YOUR CONDITION WORSENS, PLEASE CONTACT YOUR DOCTOR OR RETURN IMMEDIATELY TO THE EMERGENCEY DEPARTMENT.    EXTRA AVAILABLE RESOURCES:     DOCTOR REFERRALS  If you need further assistance with referrals to specialty physicians or you have been told you have  specific tests pending our Nurse Navigator maybe able to assist you at [phone].   FREE HEALTH SERVICES  http://[email or web address].org  May be utilized if you need help with health or social services, please call 2-1-1 for a free referral to resources in your area. 2-1-1 is a free service connecting people with information on health insurance, free clinics, pregnancy, mental health, dental care, food assistance, housing, and substance abuse counseling.   MEDICAL RECORDS AND TESTS  Certain laboratory test results do not come back the same day, for example: urine cultures may take 3 days. We will attempt to contact you if other important findings are noted. Some lab testing may take 2-5 days. Radiology films are reviewed to ensure accuracy.  If there is any discrepancy, we will notify you.   MY CHART  free, easy, secure way to view portions of your health information.  For assistance or to create an account, call [phone], option 4.  If you have questions about test results, please review your discharge instructions or contact you primary care physician   DISASTER DISTRESS HELP LINE  If you, or someone you care about, are feeling overwhelmed with emotions like sadness, depression, or anxiety, call [phone] or text TalkWithUs to [phone]       DISCHARGE MESSAGE:    YOU ARE THE MOST IMPORTANT FACTOR IN YOUR RECOVERY. Follow the above instructions carefully.  Take your medicines as prescribed. Most important, see your  doctor in follow-up as recommended by your ED physician.  Call your doctor if your condition worsens or if you have any new, worsening, or severe symptoms. If you require immediate assistance, return to the Emergency Department or call 911.    Thanks again for allowing [facility]   Emergency Department to serve you.

## 2020-06-28 NOTE — ED Provider Notes (Signed)
[facility]  EMERGENCY DEPARTMENT  History and Physical Exam       Patient Name: Colleen Hill, Colleen Hill  Encounter Date:  06/28/2020  Attending Physician: Misty Stanley A. Nunzio Cory, MD  PCP: Marisa Sprinkles, MD  Patient DOB:  06/02/1990  MRN:  [id number]  Room:  RAUG/RAU-G      History of Presenting Illness     Chief complaint: Vaginal Bleeding-pregnant      HPI/ROS is limited by: none  HPI/ROS given by: Patient    Colleen Hill is a G67P2 30 y.o. female LMP 05/07/20 at [redacted]w[redacted]d by dates who presents with intermittent VB since 2/25, was initially light pink but was heavier today around 4pm, passed 2 large clots.  [facility], seen by the nurse.  Denies abd pain.    2 previous pregnancies which were unremarkable.  Patient is O+ by her report.    Patient has had nausea.  She is on promethazine.  She is also taking PNV.       Review of Systems     Review of Systems   Constitutional: Negative for fatigue and fever.   HENT: Negative for rhinorrhea and sore throat.         No change in taste/smell   Respiratory: Negative for cough and shortness of breath.    Cardiovascular: Negative for chest pain.   Gastrointestinal: Positive for nausea. Negative for diarrhea and vomiting.   Genitourinary: Positive for vaginal bleeding.   Musculoskeletal: Negative for myalgias.   Neurological: Negative for headaches.   All other systems reviewed and are negative.       Allergies & Medications     Pt has No Known Allergies.    There are no discharge medications for this patient.       Past Medical History     Pt  has a past medical history of Congestive heart failure and Sleep apnea.     Past Surgical History     Pt  has no past surgical history on file.     Family History     The family history is not on file.     Social History     Pt reports that she has never smoked. She has never used smokeless tobacco. She reports that she does not drink alcohol and does not use drugs.     Physical Exam     Blood pressure 114/74, pulse 80, temperature  97.9 F (36.6 C), temperature source Tympanic, resp. rate 18, weight 75.5 kg, last menstrual period 05/07/2020, SpO2 98 %.    Physical Exam  Vitals and nursing note reviewed.   Constitutional:       Appearance: Normal appearance. She is well-developed.   HENT:      Head: Normocephalic and atraumatic.      Right Ear: External ear normal.      Left Ear: External ear normal.      Nose: Nose normal.   Eyes:      Conjunctiva/sclera: Conjunctivae normal.      Pupils: Pupils are equal, round, and reactive to light.   Cardiovascular:      Rate and Rhythm: Normal rate and regular rhythm.      Heart sounds: No murmur heard.      Pulmonary:      Effort: Pulmonary effort is normal.      Breath sounds: Normal breath sounds. No wheezing.   Abdominal:      General: Bowel sounds are normal.  Palpations: Abdomen is soft.      Tenderness: There is no abdominal tenderness.   Musculoskeletal:         General: Normal range of motion.      Cervical back: Normal range of motion and neck supple.   Skin:     General: Skin is warm and dry.      Findings: No rash.   Neurological:      General: No focal deficit present.      Mental Status: She is alert and oriented to person, place, and time.   Psychiatric:      Comments: tearful          Diagnostic Results     The results of the diagnostic studies below have been reviewed by myself:    Labs  Labs Reviewed   HCG QUANTITATIVE   HEMOGLOBIN AND HEMATOCRIT, BLOOD   ABO/RH       Radiologic Studies  US OB < 14 Weeks W Transvag    Result Date: 06/28/2020  Pregnancy of unknown location at this time. Recommend follow-up beta hCG and/or ultrasound in further evaluation. ReadingStation:[email or web address]           Medical Decision Making     The differential diagnosis includes, but is not limited to dysfunctional uterine bleeding, cervicitis, endometritis, PID, herpes genitalia, menses, ovarian cyst, ovarian torsion, intrauterine pregnancy, discomfort of pregnancy, threatened abortion, ectopic pregnancy,  blighted ovum, IUFD, appendicitis, kidney stone, pyelonephritis, UTI    This chart was generated by an EMR and may contain errors or omissions not intended by the user.  Use of PPE limits accuracy of Dragon voice recognition and spelling errors and random word insertions are unfortunately common.    ED Course & Treatment     Previous records reviewed.  D/dx, workup, anticipated clinical course discussed with patient/family.  Results reviewed with patient/family.  Most likely patient had a miscarriage but ectopic is still a possibility; patient has no abd pain, recommend serial quants and very close follow up. Patient appreciative of care.  All questions answered.  Patient/family comfortable with treatment plan.         Procedures / Critical Care     None     Diagnosis / Disposition     Clinical Impression  1. Vaginal bleeding affecting early pregnancy        Disposition  ED Disposition     ED Disposition Condition Date/Time Comment    Discharge  Thu Jun 28, 2020  8:06 PM Soleia Goar discharge to [facility].    Condition at disposition: Stable            Follow up for Discharged Patients  [facility] Emergency Department  [address]  [place] [address]  [phone]    repeat Beta HCG level in 2 days to evaluate possible ectopic; return sooner if pelvic pain or worsening symptoms      Prescriptions for Discharged Patients  There are no discharge medications for this patient.                      Clovis Riley, MD  06/29/20 [phone]

## 2020-06-28 NOTE — ED Triage Notes (Signed)
Patient to room RAUH/RAU1-W    Colleen Hill is a 30 y.o. female presenting to the ED for Vaginal Bleeding-pregnant    The patient arrived by private car and is accompanied by sibling and officers .    Nursing (triage) note reviewed for the following pertinent information:  from [facility],    Pt has been bleeding and passing blood clots since Friday spotting and yesterday morning got dark red about 1 hour passed a blood clot. Pt is soaking through a few pads an hour. Never happened before. Pt is [redacted] weeks pregnant has not seen an Ob doctor yet. 3rd baby with no complications.

## 2021-04-04 ENCOUNTER — Encounter (HOSPITAL_BASED_OUTPATIENT_CLINIC_OR_DEPARTMENT_OTHER): Payer: Self-pay

## 2023-08-29 ENCOUNTER — Emergency Department (HOSPITAL_COMMUNITY)
Admission: EM | Admit: 2023-08-29 | Discharge: 2023-08-29 | Disposition: A | Discharge status: Home / Self Care | Payer: Self-pay | Attending: Emergency Medicine | Admitting: Emergency Medicine

## 2023-08-29 ENCOUNTER — Encounter (HOSPITAL_COMMUNITY): Payer: Self-pay | Admitting: *Deleted

## 2023-08-29 ENCOUNTER — Other Ambulatory Visit: Payer: Self-pay

## 2023-08-29 DIAGNOSIS — K029 Dental caries, unspecified: Secondary | ICD-10-CM | POA: Insufficient documentation

## 2023-08-29 DIAGNOSIS — K0889 Other specified disorders of teeth and supporting structures: Secondary | ICD-10-CM

## 2023-08-29 MED ORDER — IBUPROFEN 600 MG PO TABS: 600.0000 mg | ORAL_TABLET | Freq: Four times a day (QID) | ORAL | 0 refills | Status: AC | PRN

## 2023-08-29 MED ORDER — HYDROCODONE-ACETAMINOPHEN 5-325 MG PO TABS: 1.0000 | ORAL_TABLET | Freq: Four times a day (QID) | ORAL | 0 refills | Status: AC | PRN

## 2023-08-29 MED ORDER — AMOXICILLIN-POT CLAVULANATE 875-125 MG PO TABS: 1.0000 | ORAL_TABLET | Freq: Two times a day (BID) | ORAL | 0 refills | Status: AC

## 2023-08-29 NOTE — ED Triage Notes (Signed)
 The pt has had a toothache for 2 days she does not have a dentist  she has lt lower jaw swelling

## 2023-08-29 NOTE — ED Provider Notes (Signed)
 [place] EMERGENCY DEPARTMENT AT [facility] Provider Note   CSN: [id number] Arrival date & time: 08/29/23  1556     History  Chief Complaint  Patient presents with   Dental Pain    Mary Chang is a 33 y.o. female who presents to the ED for evaluation of left lower dental pain.  She reports she is 5 months pregnant.  She states that she has had 2 days of left lower dental pain with swelling to her face.  She denies trouble swallowing, drooling, shortness of breath or change in phonation.  Reports she last saw dentist about 1 year ago.  States she is been taking Tylenol  at home without relief.  Denies any fevers, nausea or vomiting.  Denies any difficulty swallowing.   Dental Pain Associated symptoms: facial swelling        Home Medications Prior to Admission medications   Medication Sig Start Date End Date Taking? Authorizing Provider  amoxicillin-clavulanate (AUGMENTIN) 875-125 MG tablet Take 1 tablet by mouth every 12 (twelve) hours. 08/29/23  Yes Adel Aden, PA-C  HYDROcodone-acetaminophen  (NORCO/VICODIN) 5-325 MG tablet Take 1 tablet by mouth every 6 (six) hours as needed. 08/29/23  Yes Adel Aden, PA-C  ibuprofen  (ADVIL ) 600 MG tablet Take 1 tablet (600 mg total) by mouth every 6 (six) hours as needed for moderate pain (pain score 4-6). 08/29/23  Yes Adel Aden, PA-C  ferrous sulfate  325 (65 FE) MG tablet Take 1 tablet (325 mg total) by mouth daily with breakfast. 01/11/14   Havery Lions, CNM  Prenatal Vit-Fe Fumarate-FA (PRENATAL MULTIVITAMIN) TABS tablet Take 1 tablet by mouth daily at 12 noon.    [provider]  promethazine  (PHENERGAN ) 12.5 MG tablet Take 1 tablet (12.5 mg total) by mouth every 6 (six) hours as needed for nausea or vomiting. 01/14/17   Hardie Leyland, NP  terconazole  (TERAZOL 7 ) 0.4 % vaginal cream Place 1 applicator vaginally at bedtime. 01/01/17   Rasch, Bridgette Campus I, NP  vitamin B-6 (PYRIDOXINE ) 25 MG tablet Take  1 tablet (25 mg total) by mouth 3 (three) times daily as needed (nausea/vomiting). 01/01/17   Mikell, Asiyah Zahra, MD      Allergies    Patient has no known allergies.    Review of Systems   Review of Systems  HENT:  Positive for dental problem and facial swelling.   All other systems reviewed and are negative.   Physical Exam Updated Vital Signs BP (!) 128/91 (BP Location: Right Arm)   Pulse 98   Temp 98.5 F (36.9 C)   Resp 15   Ht 5\' 5"  (1.651 m)   Wt 75.2 kg   LMP 02/17/2023   SpO2 100%   BMI 27.59 kg/m  Physical Exam Vitals and nursing note reviewed.  Constitutional:      General: She is not in acute distress.    Appearance: She is well-developed.  HENT:     Head: Normocephalic and atraumatic.     Mouth/Throat:     Comments: Dental caries throughout left lower portion of jaw.  Uvula midline, handling secretions appropriately, no drooling or change in phonation.  No Ludwig angina. Eyes:     Conjunctiva/sclera: Conjunctivae normal.  Cardiovascular:     Rate and Rhythm: Normal rate and regular rhythm.     Heart sounds: No murmur heard. Pulmonary:     Effort: Pulmonary effort is normal. No respiratory distress.     Breath sounds: Normal breath sounds.  Abdominal:  Palpations: Abdomen is soft.     Tenderness: There is no abdominal tenderness.  Musculoskeletal:        General: No swelling.     Cervical back: Neck supple.  Skin:    General: Skin is warm and dry.     Capillary Refill: Capillary refill takes less than 2 seconds.  Neurological:     Mental Status: She is alert.  Psychiatric:        Mood and Affect: Mood normal.     ED Results / Procedures / Treatments   Labs (all labs ordered are listed, but only abnormal results are displayed) Labs Reviewed - No data to display  EKG None  Radiology No results found.  Procedures Procedures   Medications Ordered in ED Medications - No data to display  ED Course/ Medical Decision Making/ A&P     Medical Decision Making  33 year old female presents for evaluation.  Please see HPI for further details.  On examination the patient is afebrile, nontachycardic.  Her lung sounds are clear bilaterally, no hypoxia.  Abdomen soft and compressible.  Neurological examinations baseline.  Patient does have dental caries throughout left lower portion of jaw.  Minimal swelling to left lower portion of jaw.  Patient was offered imaging however defers due to being pregnant.  Will start patient on Augmentin, provide short course of pain medication, instruct her to take ibuprofen  for pain.  Will also [facility].  Advised that she follow-up there.  Will also send her home with dental resource guide.  Stable to discharge.  Discussed with Dr. Jeannie Milo prior to discharge.   Final Clinical Impression(s) / ED Diagnoses Final diagnoses:  Pain, dental    Rx / DC Orders ED Discharge Orders          Ordered    amoxicillin-clavulanate (AUGMENTIN) 875-125 MG tablet  Every 12 hours        08/29/23 1641    HYDROcodone-acetaminophen  (NORCO/VICODIN) 5-325 MG tablet  Every 6 hours PRN        08/29/23 1641    ibuprofen  (ADVIL ) 600 MG tablet  Every 6 hours PRN        08/29/23 1641              Adel Aden, PA-C 08/29/23 1642    Kommor, Alyse July, MD 08/30/23 1102

## 2023-08-29 NOTE — Discharge Instructions (Addendum)
 It was a pleasure taking part in your care.  As discussed, please begin taking Augmentin twice a day for 7 days.  This is an antibiotic.  Please also begin taking ibuprofen  600 mg every 6 hours as needed for the next 5 days.  You may take hydrocodone pain medication at night before bed to help with sleep.  Please follow-up with [facility] emergency dental clinic.  Please show up at 8 AM Monday through Friday.  You may also utilize dental resource guide attached to follow-up with dentist in the area.  Return to ED with any new or worsening symptoms.
